# Patient Record
Sex: Female | Born: 1955 | Race: Black or African American | Hispanic: No
Health system: Southern US, Community
[De-identification: ages and names within clinical notes are randomized; demographics above are authoritative.]

## PROBLEM LIST (undated history)

## (undated) DIAGNOSIS — K219 Gastro-esophageal reflux disease without esophagitis: Secondary | ICD-10-CM

## (undated) DIAGNOSIS — I10 Essential (primary) hypertension: Secondary | ICD-10-CM

---

## 2004-05-24 ENCOUNTER — Emergency Department (HOSPITAL_COMMUNITY): Admission: EM | Admit: 2004-05-24 | Discharge: 2004-05-24 | Payer: Self-pay | Admitting: Emergency Medicine

## 2004-05-24 ENCOUNTER — Emergency Department (HOSPITAL_COMMUNITY): Admission: EM | Admit: 2004-05-24 | Discharge: 2004-05-24 | Payer: Self-pay | Admitting: Neurology

## 2007-05-26 ENCOUNTER — Ambulatory Visit: Payer: Self-pay | Admitting: Internal Medicine

## 2007-05-26 ENCOUNTER — Ambulatory Visit: Payer: Self-pay | Admitting: *Deleted

## 2008-05-09 ENCOUNTER — Ambulatory Visit: Payer: Self-pay | Admitting: Internal Medicine

## 2008-05-20 ENCOUNTER — Inpatient Hospital Stay (HOSPITAL_COMMUNITY): Admission: AD | Admit: 2008-05-20 | Discharge: 2008-05-26 | Payer: Self-pay | Admitting: Psychiatry

## 2008-05-20 ENCOUNTER — Ambulatory Visit: Payer: Self-pay | Admitting: Psychiatry

## 2008-06-16 ENCOUNTER — Ambulatory Visit (HOSPITAL_COMMUNITY): Admission: RE | Admit: 2008-06-16 | Discharge: 2008-06-16 | Payer: Self-pay | Admitting: Family Medicine

## 2008-08-29 ENCOUNTER — Ambulatory Visit: Payer: Self-pay | Admitting: Internal Medicine

## 2008-08-29 ENCOUNTER — Encounter: Payer: Self-pay | Admitting: Internal Medicine

## 2009-03-28 ENCOUNTER — Ambulatory Visit: Payer: Self-pay | Admitting: Internal Medicine

## 2009-12-09 ENCOUNTER — Inpatient Hospital Stay (HOSPITAL_COMMUNITY): Admission: EM | Admit: 2009-12-09 | Discharge: 2009-12-18 | Payer: Self-pay | Admitting: Emergency Medicine

## 2009-12-10 ENCOUNTER — Encounter (INDEPENDENT_AMBULATORY_CARE_PROVIDER_SITE_OTHER): Payer: Self-pay

## 2009-12-11 ENCOUNTER — Ambulatory Visit: Payer: Self-pay | Admitting: Hematology and Oncology

## 2009-12-12 ENCOUNTER — Encounter (INDEPENDENT_AMBULATORY_CARE_PROVIDER_SITE_OTHER): Payer: Self-pay | Admitting: Internal Medicine

## 2009-12-12 ENCOUNTER — Ambulatory Visit: Payer: Self-pay | Admitting: Thoracic Surgery

## 2009-12-13 ENCOUNTER — Encounter: Payer: Self-pay | Admitting: Internal Medicine

## 2009-12-26 ENCOUNTER — Ambulatory Visit: Payer: Self-pay | Admitting: Internal Medicine

## 2009-12-26 LAB — CONVERTED CEMR LAB
Basophils Absolute: 0.1 10*3/uL (ref 0.0–0.1)
Eosinophils Relative: 5 % (ref 0–5)
HCT: 29.5 % — ABNORMAL LOW (ref 36.0–46.0)
Hemoglobin: 8.2 g/dL — ABNORMAL LOW (ref 12.0–15.0)
Lymphocytes Relative: 13 % (ref 12–46)
Lymphs Abs: 1.2 10*3/uL (ref 0.7–4.0)
Monocytes Absolute: 0.7 10*3/uL (ref 0.1–1.0)
Neutro Abs: 6.6 10*3/uL (ref 1.7–7.7)
RBC: 3.85 M/uL — ABNORMAL LOW (ref 3.87–5.11)
Retic Ct Pct: 0.9 % (ref 0.4–3.1)
WBC: 9 10*3/uL (ref 4.0–10.5)

## 2010-01-01 ENCOUNTER — Ambulatory Visit: Payer: Self-pay | Admitting: Family Medicine

## 2010-01-11 ENCOUNTER — Ambulatory Visit: Payer: Self-pay | Admitting: Obstetrics & Gynecology

## 2010-01-22 ENCOUNTER — Ambulatory Visit (HOSPITAL_COMMUNITY): Admission: RE | Admit: 2010-01-22 | Discharge: 2010-01-22 | Payer: Self-pay | Admitting: Gastroenterology

## 2010-03-05 ENCOUNTER — Ambulatory Visit (HOSPITAL_COMMUNITY): Admission: RE | Admit: 2010-03-05 | Discharge: 2010-03-05 | Payer: Self-pay | Admitting: Gastroenterology

## 2010-03-15 ENCOUNTER — Ambulatory Visit (HOSPITAL_COMMUNITY): Admission: RE | Admit: 2010-03-15 | Discharge: 2010-03-15 | Payer: Self-pay | Admitting: Internal Medicine

## 2010-04-01 ENCOUNTER — Ambulatory Visit: Payer: Self-pay | Admitting: Internal Medicine

## 2010-04-01 LAB — CONVERTED CEMR LAB
Basophils Absolute: 0 10*3/uL (ref 0.0–0.1)
Eosinophils Absolute: 0.2 10*3/uL (ref 0.0–0.7)
Eosinophils Relative: 4 % (ref 0–5)
Ferritin: 111 ng/mL (ref 10–291)
HCT: 41.8 % (ref 36.0–46.0)
Iron: 39 ug/dL — ABNORMAL LOW (ref 42–145)
Lymphocytes Relative: 28 % (ref 12–46)
Lymphs Abs: 1.2 10*3/uL (ref 0.7–4.0)
MCV: 79.9 fL (ref 78.0–100.0)
Neutrophils Relative %: 61 % (ref 43–77)
Platelets: 272 10*3/uL (ref 150–400)
RDW: 20.3 % — ABNORMAL HIGH (ref 11.5–15.5)
Saturation Ratios: 13 % — ABNORMAL LOW (ref 20–55)
UIBC: 264 ug/dL
WBC: 4.3 10*3/uL (ref 4.0–10.5)

## 2010-08-21 ENCOUNTER — Ambulatory Visit: Payer: Self-pay | Admitting: Family Medicine

## 2010-08-22 ENCOUNTER — Ambulatory Visit
Admission: RE | Admit: 2010-08-22 | Discharge: 2010-08-22 | Payer: Self-pay | Source: Home / Self Care | Attending: Gynecologic Oncology | Admitting: Gynecologic Oncology

## 2010-08-26 ENCOUNTER — Ambulatory Visit: Payer: Self-pay | Admitting: Obstetrics & Gynecology

## 2010-10-03 LAB — ABO/RH: ABO/RH(D): A POS

## 2010-10-03 LAB — CBC
HCT: 39.5 % (ref 36.0–46.0)
MCH: 25.7 pg — ABNORMAL LOW (ref 26.0–34.0)
MCV: 80 fL (ref 78.0–100.0)
RBC: 4.94 MIL/uL (ref 3.87–5.11)
RDW: 15.6 % — ABNORMAL HIGH (ref 11.5–15.5)
WBC: 4.8 10*3/uL (ref 4.0–10.5)

## 2010-10-03 LAB — TYPE AND SCREEN: ABO/RH(D): A POS

## 2010-10-03 LAB — DIFFERENTIAL
Basophils Absolute: 0 10*3/uL (ref 0.0–0.1)
Basophils Relative: 1 % (ref 0–1)
Eosinophils Relative: 4 % (ref 0–5)
Lymphocytes Relative: 27 % (ref 12–46)

## 2010-10-03 LAB — COMPREHENSIVE METABOLIC PANEL
ALT: 17 U/L (ref 0–35)
AST: 20 U/L (ref 0–37)
Alkaline Phosphatase: 57 U/L (ref 39–117)
CO2: 26 mEq/L (ref 19–32)
Chloride: 106 mEq/L (ref 96–112)
Creatinine, Ser: 1.11 mg/dL (ref 0.4–1.2)
Potassium: 4.1 mEq/L (ref 3.5–5.1)
Sodium: 140 mEq/L (ref 135–145)
Total Protein: 7.8 g/dL (ref 6.0–8.3)

## 2010-10-03 LAB — SURGICAL PCR SCREEN
MRSA, PCR: NEGATIVE
Staphylococcus aureus: NEGATIVE

## 2010-10-08 ENCOUNTER — Inpatient Hospital Stay (HOSPITAL_COMMUNITY)
Admission: RE | Admit: 2010-10-08 | Discharge: 2010-10-11 | DRG: 743 | Disposition: A | Discharge status: Home Health | Payer: Medicaid Other | Attending: Obstetrics & Gynecology | Admitting: Obstetrics & Gynecology

## 2010-10-08 DIAGNOSIS — F209 Schizophrenia, unspecified: Secondary | ICD-10-CM | POA: Diagnosis present

## 2010-10-08 DIAGNOSIS — K219 Gastro-esophageal reflux disease without esophagitis: Secondary | ICD-10-CM | POA: Diagnosis present

## 2010-10-08 DIAGNOSIS — Z6832 Body mass index (BMI) 32.0-32.9, adult: Secondary | ICD-10-CM

## 2010-10-08 DIAGNOSIS — R509 Fever, unspecified: Secondary | ICD-10-CM | POA: Diagnosis present

## 2010-10-08 DIAGNOSIS — Z86718 Personal history of other venous thrombosis and embolism: Secondary | ICD-10-CM | POA: Diagnosis present

## 2010-10-08 DIAGNOSIS — D259 Leiomyoma of uterus, unspecified: Principal | ICD-10-CM | POA: Diagnosis present

## 2010-10-08 LAB — HEMOGLOBIN AND HEMATOCRIT, BLOOD: Hemoglobin: 12.5 g/dL (ref 12.0–15.0)

## 2010-10-09 LAB — CBC
HCT: 34.7 % — ABNORMAL LOW (ref 36.0–46.0)
MCH: 25.6 pg — ABNORMAL LOW (ref 26.0–34.0)
MCV: 78.5 fL (ref 78.0–100.0)
RBC: 4.42 MIL/uL (ref 3.87–5.11)
WBC: 9.1 10*3/uL (ref 4.0–10.5)

## 2010-10-09 LAB — BASIC METABOLIC PANEL
BUN: 6 mg/dL (ref 6–23)
CO2: 25 mEq/L (ref 19–32)
Chloride: 101 mEq/L (ref 96–112)
Creatinine, Ser: 1 mg/dL (ref 0.4–1.2)
Glucose, Bld: 146 mg/dL — ABNORMAL HIGH (ref 70–99)
Potassium: 4.3 mEq/L (ref 3.5–5.1)

## 2010-10-10 NOTE — Op Note (Addendum)
[name], [name]               ACCOUNT NO.:  [id number]  MEDICAL RECORD NO.:  [id number]          PATIENT TYPE:  WOC  LOCATION:  WOC                          FACILITY:  [facility]  PHYSICIAN:  De Blanch, M.D.DATE OF BIRTH:  Sep 10, 1955  DATE OF PROCEDURE: DATE OF DISCHARGE:                              OPERATIVE REPORT   PREOPERATIVE DIAGNOSIS:  Symptomatic uterine fibroids.  POSTOPERATIVE DIAGNOSIS:  Symptomatic uterine fibroids.  PROCEDURE:  Total abdominal hysterectomy, bilateral salpingo- oophorectomy.  SURGEON:  De Blanch, M.D.  ASSISTANT: 1. Antionette Char, M.D. 2. Telford Nab, R.N.  ANESTHESIA:  General orotracheal tube.  ESTIMATED BLOOD LOSS:  400 mL.  SURGICAL FINDINGS:  At the time of exploratory laparotomy, the patient had a large (12 cm) with a myoma that was pedunculated arising from the fundus of the uterus and extending in the upper abdomen.  A new parasitic blood supply had been derived from the mesentery of the jejunum.  The tubes and ovaries appeared normal.  The uterus had some additional small uterine fibroids.  There were very large veins on the ovarian pedicle as well as uterine and bladder flap.  Exploration of the upper abdomen was normal.  The appendix appeared normal.  PROCEDURE:  The patient was brought to the operating room, and after satisfactory attainment of general anesthesia, was placed in a modified lithotomy position in [place] stirrups.  The anterior abdominal wall, perineum and vagina were prepped.  Foley catheter was inserted, and the patient was draped.  The abdomen was entered through a midline incision. Pelvic washings were obtained.  The upper abdomen and pelvis were explored with the above-noted findings.  It was noted that the parasitic blood supply was being derived from the mesentery of the jejunum.  Using sharp and blunt dissection and cautery for hemostasis, the mesentery was freed from the  uterine fibroid.  The Bookwalter retractor was assembled, and small bowel was packed out of the pelvis.  The uterus was elevated, and the retroperitoneal spaces were opened.  The ovarian vessels were isolated away from the ureter, clamped, free-tied and suture-ligated. On the left, additional bleeding was noted in additional ligature, and the hemoclips were used to achieve hemostasis.  The bladder flap was advanced with sharp and blunt dissection, and hemostasis again being achieved with cautery.  Uterine vessels were skeletonized and clamped, cut, and suture-ligated.  In a stepwise fashion the paracervical and cardinal ligaments were clamped, cut and suture-ligated.  In order to gain better exposure to the cervix, the uterus was amputated about midway down the cervix and removed from the operative field.  The remainder of the cervix was then removed, the vaginal angles were cross- clamped and divided, and the vagina transected from its connection to the cervix.  The cervix was handed off in the operative field.  Vaginal angles were transfixed with 0 Vicryl and the central portion of vagina closed with interrupted figure-of-eight sutures of 0 Vicryl.  The pelvis was irrigated and found to be hemostatic.  The packs and retractors were removed.  The mesenteric attachment to the fibroid was reinspected and found to be hemostatic.  The abdomen  was closed in layers, the first being a running mass closure using #1 PDS incorporating the fascia, rectus muscle and peritoneum. Subcutaneous tissue was irrigated.  Additional hemostasis was achieved with cautery.  Skin was closed with skin staples.  A pressure dressing was applied.  The patient was awakened from anesthesia and taken to the recovery room in satisfactory condition.  Sponge, needle and instrument counts correct x2.     De Blanch, M.D.     DC/MEDQ  D:  10/08/2010  T:  10/08/2010  Job:  [id number]  cc:   Telford Nab,  R.N. [address] [place], [place] [address]  Roseanna Rainbow, M.D. Fax: [phone]  Altha Harm, MD  Shelbie Proctor Shawnie Pons, M.D.  Electronically Signed by De Blanch M.D. on 10/09/2010 07:30:46 AM

## 2010-11-18 NOTE — Discharge Summary (Signed)
  [name], [name]               ACCOUNT NO.:  [id number]  MEDICAL RECORD NO.:  [id number]           PATIENT TYPE:  I  LOCATION:  1531                         FACILITY:  [facility]  PHYSICIAN:  Roseanna Rainbow, M.D.DATE OF BIRTH:  [birth year]  DATE OF ADMISSION:  10/08/2010 DATE OF DISCHARGE:  10/11/2010                              DISCHARGE SUMMARY   CHIEF COMPLAINT:  The patient is a 55 year old with uterine fibroids, who presents for a total abdominal hysterectomy and bilateral salpingo- oophorectomy.  (Please see the dictated history and physical for further details.)  HOSPITAL COURSE:  The patient was admitted and underwent a total abdominal hysterectomy and bilateral salpingo-oophorectomy.  (Please see the dictated operative summary for findings.)  On postoperative day #1, a hemoglobin was 11.3.  On postoperative day #2, she had a low-grade temperature of 100.1.  Her diet was  advanced.  She remained afebrilefor the remainder of the hospital course.  She was discharged home on postoperative #3, tolerating a regular diet.  DISCHARGE DIAGNOSES: 1. Uterine fibroids. 2. History of lower leg deep venous thrombosis that was treated with a     caval filter.  PROCEDURE:  Total abdominal hysterectomy and bilateral salpingo- oophorectomy.  CONDITION ON DISCHARGE:  Good.  DIET:  Regular.  ACTIVITY:  Follow a progressive activity.  MEDICATIONS:  Calcium, iron, Glucophage, fish oil, aspirin, olanzapine, omeprazole, Percocet 5/325 1 tablet q.5 h as needed.  DISPOSITION:  The patient is to follow up with her gynecologist's office.     Roseanna Rainbow, M.D.     [name]  D:  11/12/2010  T:  11/12/2010  Job:  [id number]  cc:   Valentino Nose, MD  Electronically Signed by Antionette Char M.D. on 11/18/2010 09:27:02 PM

## 2010-11-21 ENCOUNTER — Ambulatory Visit: Payer: Medicaid Other | Attending: Gynecologic Oncology | Admitting: Gynecologic Oncology

## 2010-11-21 DIAGNOSIS — D259 Leiomyoma of uterus, unspecified: Secondary | ICD-10-CM | POA: Insufficient documentation

## 2010-11-21 DIAGNOSIS — N840 Polyp of corpus uteri: Secondary | ICD-10-CM | POA: Insufficient documentation

## 2010-11-21 DIAGNOSIS — N859 Noninflammatory disorder of uterus, unspecified: Secondary | ICD-10-CM | POA: Insufficient documentation

## 2010-11-21 DIAGNOSIS — N895 Stricture and atresia of vagina: Secondary | ICD-10-CM | POA: Insufficient documentation

## 2010-11-21 DIAGNOSIS — Z09 Encounter for follow-up examination after completed treatment for conditions other than malignant neoplasm: Secondary | ICD-10-CM | POA: Insufficient documentation

## 2010-11-27 LAB — DIFFERENTIAL
Band Neutrophils: 0 % (ref 0–10)
Band Neutrophils: 0 % (ref 0–10)
Basophils Absolute: 0 10*3/uL (ref 0.0–0.1)
Basophils Absolute: 0 10*3/uL (ref 0.0–0.1)
Basophils Absolute: 0 10*3/uL (ref 0.0–0.1)
Basophils Absolute: 0 10*3/uL (ref 0.0–0.1)
Basophils Relative: 0 % (ref 0–1)
Basophils Relative: 0 % (ref 0–1)
Basophils Relative: 0 % (ref 0–1)
Blasts: 0 %
Blasts: 0 %
Blasts: 0 %
Blasts: 0 %
Eosinophils Absolute: 0 10*3/uL (ref 0.0–0.7)
Eosinophils Absolute: 0 10*3/uL (ref 0.0–0.7)
Eosinophils Absolute: 0.1 10*3/uL (ref 0.0–0.7)
Eosinophils Absolute: 0.4 10*3/uL (ref 0.0–0.7)
Eosinophils Absolute: 0.4 10*3/uL (ref 0.0–0.7)
Eosinophils Absolute: 0.5 10*3/uL (ref 0.0–0.7)
Eosinophils Relative: 1 % (ref 0–5)
Eosinophils Relative: 3 % (ref 0–5)
Eosinophils Relative: 4 % (ref 0–5)
Eosinophils Relative: 4 % (ref 0–5)
Lymphocytes Relative: 10 % — ABNORMAL LOW (ref 12–46)
Lymphocytes Relative: 11 % — ABNORMAL LOW (ref 12–46)
Lymphocytes Relative: 15 % (ref 12–46)
Lymphs Abs: 1.1 10*3/uL (ref 0.7–4.0)
Lymphs Abs: 1.2 10*3/uL (ref 0.7–4.0)
Lymphs Abs: 1.4 10*3/uL (ref 0.7–4.0)
Metamyelocytes Relative: 0 %
Metamyelocytes Relative: 0 %
Metamyelocytes Relative: 0 %
Metamyelocytes Relative: 0 %
Monocytes Absolute: 0.4 10*3/uL (ref 0.1–1.0)
Monocytes Absolute: 0.6 10*3/uL (ref 0.1–1.0)
Monocytes Absolute: 0.6 10*3/uL (ref 0.1–1.0)
Monocytes Absolute: 0.9 10*3/uL (ref 0.1–1.0)
Monocytes Absolute: 1.2 10*3/uL — ABNORMAL HIGH (ref 0.1–1.0)
Monocytes Relative: 3 % (ref 3–12)
Monocytes Relative: 4 % (ref 3–12)
Monocytes Relative: 5 % (ref 3–12)
Monocytes Relative: 5 % (ref 3–12)
Monocytes Relative: 7 % (ref 3–12)
Monocytes Relative: 9 % (ref 3–12)
Myelocytes: 0 %
Myelocytes: 0 %
Myelocytes: 0 %
Myelocytes: 0 %
Myelocytes: 0 %
Myelocytes: 1 %
Neutro Abs: 10 10*3/uL — ABNORMAL HIGH (ref 1.7–7.7)
Neutro Abs: 11 10*3/uL — ABNORMAL HIGH (ref 1.7–7.7)
Neutro Abs: 9 10*3/uL — ABNORMAL HIGH (ref 1.7–7.7)
Neutrophils Relative %: 78 % — ABNORMAL HIGH (ref 43–77)
Neutrophils Relative %: 80 % — ABNORMAL HIGH (ref 43–77)
Neutrophils Relative %: 81 % — ABNORMAL HIGH (ref 43–77)
Neutrophils Relative %: 82 % — ABNORMAL HIGH (ref 43–77)
Promyelocytes Absolute: 0 %
Promyelocytes Absolute: 0 %
nRBC: 0 /100 WBC
nRBC: 0 /100 WBC
nRBC: 0 /100 WBC
nRBC: 0 /100 WBC
nRBC: 0 /100 WBC

## 2010-11-27 LAB — RETICULOCYTES
RBC.: 3.76 MIL/uL — ABNORMAL LOW (ref 3.87–5.11)
Retic Ct Pct: 1.4 % (ref 0.4–3.1)

## 2010-11-27 LAB — CBC
HCT: 22.4 % — ABNORMAL LOW (ref 36.0–46.0)
HCT: 25 % — ABNORMAL LOW (ref 36.0–46.0)
HCT: 26.7 % — ABNORMAL LOW (ref 36.0–46.0)
HCT: 27.9 % — ABNORMAL LOW (ref 36.0–46.0)
Hemoglobin: 7.7 g/dL — ABNORMAL LOW (ref 12.0–15.0)
Hemoglobin: 7.8 g/dL — ABNORMAL LOW (ref 12.0–15.0)
Hemoglobin: 8.2 g/dL — ABNORMAL LOW (ref 12.0–15.0)
Hemoglobin: 8.6 g/dL — ABNORMAL LOW (ref 12.0–15.0)
MCHC: 29.5 g/dL — ABNORMAL LOW (ref 30.0–36.0)
MCHC: 31 g/dL (ref 30.0–36.0)
MCHC: 31.3 g/dL (ref 30.0–36.0)
MCHC: 31.3 g/dL (ref 30.0–36.0)
MCHC: 31.4 g/dL (ref 30.0–36.0)
MCHC: 31.5 g/dL (ref 30.0–36.0)
MCHC: 31.7 g/dL (ref 30.0–36.0)
MCV: 63.4 fL — ABNORMAL LOW (ref 78.0–100.0)
MCV: 70.3 fL — ABNORMAL LOW (ref 78.0–100.0)
MCV: 71.2 fL — ABNORMAL LOW (ref 78.0–100.0)
MCV: 71.4 fL — ABNORMAL LOW (ref 78.0–100.0)
MCV: 71.4 fL — ABNORMAL LOW (ref 78.0–100.0)
MCV: 73.7 fL — ABNORMAL LOW (ref 78.0–100.0)
MCV: 74.5 fL — ABNORMAL LOW (ref 78.0–100.0)
Platelets: 244 10*3/uL (ref 150–400)
Platelets: 262 10*3/uL (ref 150–400)
Platelets: 276 10*3/uL (ref 150–400)
Platelets: 283 10*3/uL (ref 150–400)
Platelets: 318 10*3/uL (ref 150–400)
Platelets: 345 10*3/uL (ref 150–400)
Platelets: 354 10*3/uL (ref 150–400)
RBC: 3.34 MIL/uL — ABNORMAL LOW (ref 3.87–5.11)
RBC: 3.35 MIL/uL — ABNORMAL LOW (ref 3.87–5.11)
RBC: 3.42 MIL/uL — ABNORMAL LOW (ref 3.87–5.11)
RBC: 3.53 MIL/uL — ABNORMAL LOW (ref 3.87–5.11)
RBC: 3.65 MIL/uL — ABNORMAL LOW (ref 3.87–5.11)
RDW: 29 % — ABNORMAL HIGH (ref 11.5–15.5)
RDW: 29.1 % — ABNORMAL HIGH (ref 11.5–15.5)
RDW: 29.6 % — ABNORMAL HIGH (ref 11.5–15.5)
RDW: 30.1 % — ABNORMAL HIGH (ref 11.5–15.5)
RDW: 33.8 % — ABNORMAL HIGH (ref 11.5–15.5)
WBC: 11.7 10*3/uL — ABNORMAL HIGH (ref 4.0–10.5)
WBC: 12.2 10*3/uL — ABNORMAL HIGH (ref 4.0–10.5)
WBC: 12.5 10*3/uL — ABNORMAL HIGH (ref 4.0–10.5)
WBC: 12.6 10*3/uL — ABNORMAL HIGH (ref 4.0–10.5)
WBC: 14.1 10*3/uL — ABNORMAL HIGH (ref 4.0–10.5)

## 2010-11-27 LAB — CARDIAC PANEL(CRET KIN+CKTOT+MB+TROPI)
CK, MB: 0.4 ng/mL (ref 0.3–4.0)
Relative Index: INVALID (ref 0.0–2.5)
Total CK: 55 U/L (ref 7–177)
Troponin I: 0.01 ng/mL (ref 0.00–0.06)

## 2010-11-27 LAB — CROSSMATCH
ABO/RH(D): A POS
Antibody Screen: NEGATIVE

## 2010-11-27 LAB — CULTURE, BLOOD (ROUTINE X 2)
Culture: NO GROWTH
Culture: NO GROWTH

## 2010-11-27 LAB — URINALYSIS, MICROSCOPIC ONLY
Glucose, UA: NEGATIVE mg/dL
Hgb urine dipstick: NEGATIVE
Leukocytes, UA: NEGATIVE
Protein, ur: NEGATIVE mg/dL
Specific Gravity, Urine: 1.014 (ref 1.005–1.030)
Specific Gravity, Urine: 1.024 (ref 1.005–1.030)
Urobilinogen, UA: 0.2 mg/dL (ref 0.0–1.0)
Urobilinogen, UA: 1 mg/dL (ref 0.0–1.0)

## 2010-11-27 LAB — POCT I-STAT, CHEM 8
BUN: 11 mg/dL (ref 6–23)
Calcium, Ion: 1.17 mmol/L (ref 1.12–1.32)
Creatinine, Ser: 1.1 mg/dL (ref 0.4–1.2)
Glucose, Bld: 112 mg/dL — ABNORMAL HIGH (ref 70–99)
Hemoglobin: 8.8 g/dL — ABNORMAL LOW (ref 12.0–15.0)
Sodium: 139 mEq/L (ref 135–145)
TCO2: 21 mmol/L (ref 0–100)

## 2010-11-27 LAB — COMPREHENSIVE METABOLIC PANEL
ALT: <8 U/L (ref 0–35)
AST: 14 U/L (ref 0–37)
CO2: 24 mEq/L (ref 19–32)
Calcium: 8.6 mg/dL (ref 8.4–10.5)
Chloride: 108 mEq/L (ref 96–112)
GFR calc Af Amer: >60 mL/min (ref >60)
GFR calc non Af Amer: 54 mL/min — ABNORMAL LOW (ref >60)
Sodium: 137 mEq/L (ref 135–145)

## 2010-11-27 LAB — HEMOGLOBIN AND HEMATOCRIT, BLOOD
HCT: 26 % — ABNORMAL LOW (ref 36.0–46.0)
HCT: 27.5 % — ABNORMAL LOW (ref 36.0–46.0)
Hemoglobin: 8.5 g/dL — ABNORMAL LOW (ref 12.0–15.0)

## 2010-11-27 LAB — BASIC METABOLIC PANEL
BUN: 5 mg/dL — ABNORMAL LOW (ref 6–23)
BUN: 6 mg/dL (ref 6–23)
BUN: 8 mg/dL (ref 6–23)
CO2: 21 mEq/L (ref 19–32)
CO2: 23 mEq/L (ref 19–32)
CO2: 23 mEq/L (ref 19–32)
Calcium: 8.9 mg/dL (ref 8.4–10.5)
Chloride: 103 mEq/L (ref 96–112)
Chloride: 107 mEq/L (ref 96–112)
Chloride: 108 mEq/L (ref 96–112)
Creatinine, Ser: 0.91 mg/dL (ref 0.4–1.2)
Creatinine, Ser: 1.05 mg/dL (ref 0.4–1.2)
Creatinine, Ser: 1.08 mg/dL (ref 0.4–1.2)
GFR calc Af Amer: >60 mL/min (ref >60)
GFR calc non Af Amer: 53 mL/min — ABNORMAL LOW (ref >60)
GFR calc non Af Amer: >60 mL/min (ref >60)
Glucose, Bld: 110 mg/dL — ABNORMAL HIGH (ref 70–99)
Glucose, Bld: 95 mg/dL (ref 70–99)
Potassium: 3.6 mEq/L (ref 3.5–5.1)
Sodium: 131 mEq/L — ABNORMAL LOW (ref 135–145)
Sodium: 136 mEq/L (ref 135–145)

## 2010-11-27 LAB — URINE CULTURE

## 2010-11-27 LAB — FERRITIN: Ferritin: 3 ng/mL — ABNORMAL LOW (ref 10–291)

## 2010-11-27 LAB — GLUCOSE, CAPILLARY: Glucose-Capillary: 102 mg/dL — ABNORMAL HIGH (ref 70–99)

## 2010-11-27 LAB — HEPARIN LEVEL (UNFRACTIONATED): Heparin Unfractionated: 0.47 IU/mL (ref 0.30–0.70)

## 2010-11-27 LAB — IRON AND TIBC: UIBC: 408 ug/dL

## 2010-11-27 LAB — PROTIME-INR: Prothrombin Time: 15.1 seconds (ref 11.6–15.2)

## 2010-12-06 NOTE — Consult Note (Signed)
  NAMETAREN, DYMEK               ACCOUNT NO.:  [id number]  MEDICAL RECORD NO.:  [id number]           PATIENT TYPE:  LOCATION:                                 FACILITY:  PHYSICIAN:  Laurette Schimke, MD     DATE OF BIRTH:  1955-12-13  DATE OF CONSULTATION:  11/21/2010 DATE OF DISCHARGE:                                CONSULTATION   REASON FOR VISIT:  Postoperative check.  HISTORY OF PRESENT ILLNESS:  This is a 55 year old who presented to the emergency room and was noted to have an ulcerated esophageal lesion. Significant anemia was appreciated and she was noted to have a deep venous thrombus.  Because of the upper GI bleed, an IVC filter was obtained and an MRI of the pelvis was done.  This demonstrated a uterus diffusely enlarged and noted to have multiple leiomyomas.  Subsequent PET evaluation for evaluation of erosive gastritis did not show any evidence of a local esophageal cancer or metastatic disease.  However, moderate FDG activity was noted within the uterine mass.  On October 08, 2010, she underwent a total abdominal hysterectomy, bilateral salpingo- oophorectomy.  Final pathology was notable for uterine leiomyomata, atrophic and benign endometrial polyps without any evidence of hyperplasia or malignancy, and bilateral benign serous cysts. Postoperatively, Ms. Lippert did well and is without complaints.  She denies nausea, vomiting.  No fever, chills, abdominal pain.  She reports normal bowel movements.  She denies any vaginal bleeding.  PHYSICAL EXAMINATION:  VITAL SIGNS:  Weight 179 pounds, blood pressure 118/70, pulse of 78. ABDOMEN:  Soft, nontender.  Midline incision is appreciated with no evidence of separation, cellulitis, or erythema or hernia.  There is no rebound or guarding. PELVIC EXAMINATION:  Speculum examination was attempted; however, significant vaginal stenosis was appreciated and the patient was very uncomfortable.  Digital examination was unremarkable  for the presence of any cul-de-sac masses or tenderness and there was no evidence of discharge or blood on examination.  IMPRESSION:  Status post total abdominal hysterectomy, bilateral salpingo-oophorectomy all with benign findings. Ms. Summons has been advised to follow up with Dr. Shawnie Pons in 6 months.  She has been advised to follow up for any abdomen or pelvic discomfort or vaginal bleeding.     Laurette Schimke, MD     WB/MEDQ  D:  11/26/2010  T:  11/27/2010  Job:  [id number]  cc:   Telford Nab, R.N. [address] [place], [place] [address]  Altha Harm, MD  Shelbie Proctor. Shawnie Pons, M.D.  Electronically Signed by Laurette Schimke MD on 12/06/2010 10:03:19 AM

## 2010-12-30 ENCOUNTER — Ambulatory Visit: Payer: Medicaid Other | Admitting: Family Medicine

## 2011-01-20 ENCOUNTER — Ambulatory Visit: Payer: Medicaid Other | Admitting: Obstetrics & Gynecology

## 2011-01-20 DIAGNOSIS — Z09 Encounter for follow-up examination after completed treatment for conditions other than malignant neoplasm: Secondary | ICD-10-CM

## 2011-01-21 NOTE — Group Therapy Note (Signed)
NAMEINITA, URAM               ACCOUNT NO.:  [id number]  MEDICAL RECORD NO.:  [id number]           PATIENT TYPE:  A  LOCATION:  [facility]                   FACILITY:  [facility]  PHYSICIAN:  Allie Bossier, MD        DATE OF BIRTH:  Nov 25, 1955  DATE OF SERVICE:  01/20/2011                                 CLINIC NOTE  Ms. Shellenbarger is a 55 year old para 1 who had TAH, exploratory laparotomy, and BSO by Dr. Tamela Oddi and Dr. De Blanch because of a very large uterine fibroid that was worrisome for malignancy, turns out that it is not a malignancy; however, it did have a parasitic blood supply from the jejunal mesentery, which required separation of that. Her surgery was apparently uncomplicated.  She reports no problems to me.  She has not been sexually active for "years."  She denies any postop problems.  On exam, her vertical incision is well healed.  She would not tolerate a speculum, but I did manage to convinced her that let me do a bimanual exam with one finger.  I felt no masses and the cuff seems well healed on exam.  ASSESSMENT AND PLAN:  Postop, doing well.  We will have her back in a year for an annual exam.  She understands that she does not need Pap smears any more, but that we would recommend vulvar and vaginal inspection on a yearly basis.     Allie Bossier, MD    MCD/MEDQ  D:  01/20/2011  T:  01/21/2011  Job:  [id number]

## 2011-01-21 NOTE — H&P (Signed)
[name], [name]               ACCOUNT NO.:  [id number]   MEDICAL RECORD NO.:  [id number]          PATIENT TYPE:  IPS   LOCATION:  0406                          [facility]ACILITY[facility]  [facility]   PHYSICIAN:  Ashley Jungling, MD  DATE OF BIRTH:  [birth year]   DATE OF ADMISSION:  05/20/2008  DATE OF DISCHARGE:                       PSYCHIATRIC ADMISSION ASSESSMENT   IDENTIFYING INFORMATION:  This is an involuntary admission of this 55-  year-old divorced African American female.  The [employer]  brought her to the [facility]  yesterday.  They had found her wandering the streets.  She was noted to  have significant thought blocking, perseveration and illogical thought  trends.  She was guarded.  She stated she left her house because she did  not feel safe.  She was looking for some place to go where she could  feel safe for a few days.  She is known to [facility].  She had a similar episode back in 2005.  She was given a  diagnosis of psychotic disorder, not otherwise specified.  She was  maintained on an atypical antipsychotic for awhile, cleared and returned  to work.  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  She is quite anxious to return to work.  Yesterday upon  admission, it was difficult to direct her.  She was quite paranoid.  She  was given Haldol 5 mg and Ativan 2 mg IM on admission.  That was  followed up with Haldol on deck 50 mg.  She has now started to take the  Haldol p.o.   PAST PSYCHIATRIC HISTORY:  Today she can tell me that a number of years  ago she went to [facility] but I got cleared.  She  could also report that she used to see [name], one of the nurses  down there.  She did not remember exactly what medication she was  taking.   SOCIAL HISTORY:  She reports having been to a multitude of colleges,  [employer], [employer] and [employer].  She states she has been married and  divorced  once.  She has a step-son whom she is close to.  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  She apparently has an  apartment and she has paper work from a variety of Teacher, music, Catering manager.  out in the community.   FAMILY HISTORY:  She denies.   ALCOHOL & DRUG HISTORY:  She denies.   PRIMARY CARE PHYSICIAN:  [name], M.D.  She has not had any  psychiatric care in several years now, probably since 2006.   PAST MEDICAL HISTORY:  She reports sinus issues from allergies.   MEDICATIONS:  She was not prescribed any.   ALLERGIES:  No known drug allergies.   PHYSICAL EXAMINATION:  GENERAL:  She is a well-developed and well-  nourished female, who appears her stated age of 16.  She had no  remarkable findings.   REVIEW OF SYSTEMS:  No positive findings.  MENTAL STATUS EXAM:  Today she is alert and at least superficially  oriented.  She has cleaned up.  She appears to be appropriately groomed,  dressed and nourished today.  Her speech is more normal.  She can follow  a conversation.  She can answer appropriately.  Her mood is calm.  She  is not paranoid.  Thought processes are clear.  She wants to go back to  work.  Judgment and insight are fair.  Concentration and memory are  fair.  Intelligence is at least average.  She denies suicidal or  homicidal ideations.  She is not  as paranoid today as she was  yesterday.   DIAGNOSES:  AXIS I.  Psychotic disorder, not otherwise specified.  AXIS II.  No diagnosis.  AXIS III.  None known.  AXIS IV.  Severe, noncompliant.  AXIS V.  30.   PLAN:  To admit for safety and stabilization.  We started Haldol p.o.  and Decanoate  We will refer to her back to [facility].   The estimated length of stay is five days.      [name], P.A.-C.      Ashley Jungling, MD  Electronically Signed    MD/MEDQ  D:  05/21/2008  T:  05/21/2008  Job:  [id number]

## 2011-01-21 NOTE — Discharge Summary (Signed)
[name], [name]               ACCOUNT NO.:  [id number]   MEDICAL RECORD NO.:  [id number]          PATIENT TYPE:  IPS   LOCATION:  0406                          [facility]ILITY[facility]   PHYSICIAN:  Anselm Jungling, MD  DATE OF BIRTH:  1955-09-24   DATE OF ADMISSION:  05/20/2008  DATE OF DISCHARGE:  05/26/2008                               DISCHARGE SUMMARY   IDENTIFYING DATA/REASON FOR ADMISSION:  The patient is a 55 year old  single African American female who came to [place] with a history of  psychotic disorder.  She was brought in by the police due to  exacerbation of her psychotic disorder, which involved disorganized  thinking and behavior.  Please refer to the admission note for further  details pertaining to the symptoms, circumstances and history that led  to her hospitalization.  She was given initial Axis I diagnosis of  psychosis NOS.   MEDICAL AND LABORATORY:  The patient was medically and physically  assessed by the psychiatric nurse practitioner.  She was in good health  without any active or chronic medical problems.  There were no  significant medical issues.   HOSPITAL COURSE:  The patient was admitted to the adult inpatient  psychiatric service.  She presented as a well-nourished, normally-  developed adult female who was unable to give any clear or coherent  history for the first 3-4 days of her hospital stay.  However, we were  able to learn more about her, and eventually we were able to make  contact with individuals from a church, that she has been very involved  in, both as an attendee, and as someone who volunteers a great deal with  various programs that they have.  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   She presented with disorganized thinking, disorientation, and appeared  to be responding to internal stimuli.  She was generally pleasant and  cooperative.   She was started on a trial of Haldol.   This was well tolerated, and over  the course of 5-6 days, she cleared nicely.  Her friend Noreene Larsson from her  church was able to come and meet with the patient, the case manager and  the undersigned, and we learned that her church is enormously supportive  to her and will continue to be so.   By the seventh hospital day the patient appeared appropriate for  discharge.  She appeared to be back to her baseline level of functioning  and was tolerating medication.  She agreed to the following aftercare  plan.   AFTERCARE:  The patient was to follow up with the [facility] with  an appointment with her psychiatrist on June 01, 2008.   DISCHARGE MEDICATIONS:  Haldol 10 mg p.o. nightly.   DISCHARGE DIAGNOSES:  Axis I:  Psychosis, NOS.  Axis II:  Deferred.  Axis III:  No acute or chronic illnesses.  Axis IV:  Stressors severe.  Axis V:  GAF on discharge 65.      Anselm Jungling, MD  Electronically Signed  SPB/MEDQ  D:  06/01/2008  T:  06/01/2008  Job:  [id number]

## 2011-04-16 ENCOUNTER — Other Ambulatory Visit: Payer: Self-pay | Admitting: Internal Medicine

## 2011-04-16 DIAGNOSIS — Z1231 Encounter for screening mammogram for malignant neoplasm of breast: Secondary | ICD-10-CM

## 2011-04-21 ENCOUNTER — Ambulatory Visit (HOSPITAL_COMMUNITY)
Admission: RE | Admit: 2011-04-21 | Discharge: 2011-04-21 | Disposition: A | Discharge status: Home / Self Care | Payer: Medicaid Other | Source: Ambulatory Visit | Attending: Internal Medicine | Admitting: Internal Medicine

## 2011-04-21 DIAGNOSIS — Z1231 Encounter for screening mammogram for malignant neoplasm of breast: Secondary | ICD-10-CM | POA: Insufficient documentation

## 2011-04-22 ENCOUNTER — Ambulatory Visit (HOSPITAL_COMMUNITY): Payer: Medicaid Other

## 2011-04-23 ENCOUNTER — Other Ambulatory Visit: Payer: Self-pay | Admitting: Internal Medicine

## 2011-04-23 DIAGNOSIS — R928 Other abnormal and inconclusive findings on diagnostic imaging of breast: Secondary | ICD-10-CM

## 2011-04-30 ENCOUNTER — Ambulatory Visit
Admission: RE | Admit: 2011-04-30 | Discharge: 2011-04-30 | Disposition: A | Discharge status: Home / Self Care | Payer: Medicaid Other | Source: Ambulatory Visit | Attending: Internal Medicine | Admitting: Internal Medicine

## 2011-04-30 DIAGNOSIS — R928 Other abnormal and inconclusive findings on diagnostic imaging of breast: Secondary | ICD-10-CM

## 2011-06-11 LAB — COMPREHENSIVE METABOLIC PANEL
ALT: 19
AST: 30
Albumin: 3.4 — ABNORMAL LOW
Alkaline Phosphatase: 39
BUN: 18
CO2: 26
Calcium: 9.1
Chloride: 110
Creatinine, Ser: 1.28 — ABNORMAL HIGH
GFR calc Af Amer: 53 — ABNORMAL LOW
GFR calc non Af Amer: 43 — ABNORMAL LOW
Glucose, Bld: 92
Potassium: 3.7
Sodium: 142
Total Bilirubin: 0.9
Total Protein: 6.8

## 2011-06-11 LAB — DRUGS OF ABUSE SCREEN W/O ALC, ROUTINE URINE
Cocaine Metabolites: NEGATIVE
Creatinine,U: 339.5
Marijuana Metabolite: NEGATIVE
Methadone: NEGATIVE
Opiate Screen, Urine: NEGATIVE
Propoxyphene: NEGATIVE

## 2011-06-11 LAB — URINE MICROSCOPIC-ADD ON

## 2011-06-11 LAB — URINALYSIS, ROUTINE W REFLEX MICROSCOPIC
Nitrite: NEGATIVE
Protein, ur: 30 — AB
Specific Gravity, Urine: 1.033 — ABNORMAL HIGH
Urobilinogen, UA: 0.2

## 2011-06-11 LAB — CBC
MCV: 85.7
Platelets: 230
RBC: 4.56
WBC: 5.3

## 2011-06-11 LAB — TSH: TSH: 0.903

## 2012-03-31 ENCOUNTER — Other Ambulatory Visit (HOSPITAL_COMMUNITY): Payer: Self-pay | Admitting: Family Medicine

## 2012-03-31 DIAGNOSIS — Z1231 Encounter for screening mammogram for malignant neoplasm of breast: Secondary | ICD-10-CM

## 2012-04-02 IMAGING — CR DG FEMUR 2V*L*
3 series · 3 of 3 positions shown · non-contrast
Comparison: None.

CLINICAL DATA: Recent fall

LEFT FEMUR - 2 VIEW

[t femur with hip  ap left]
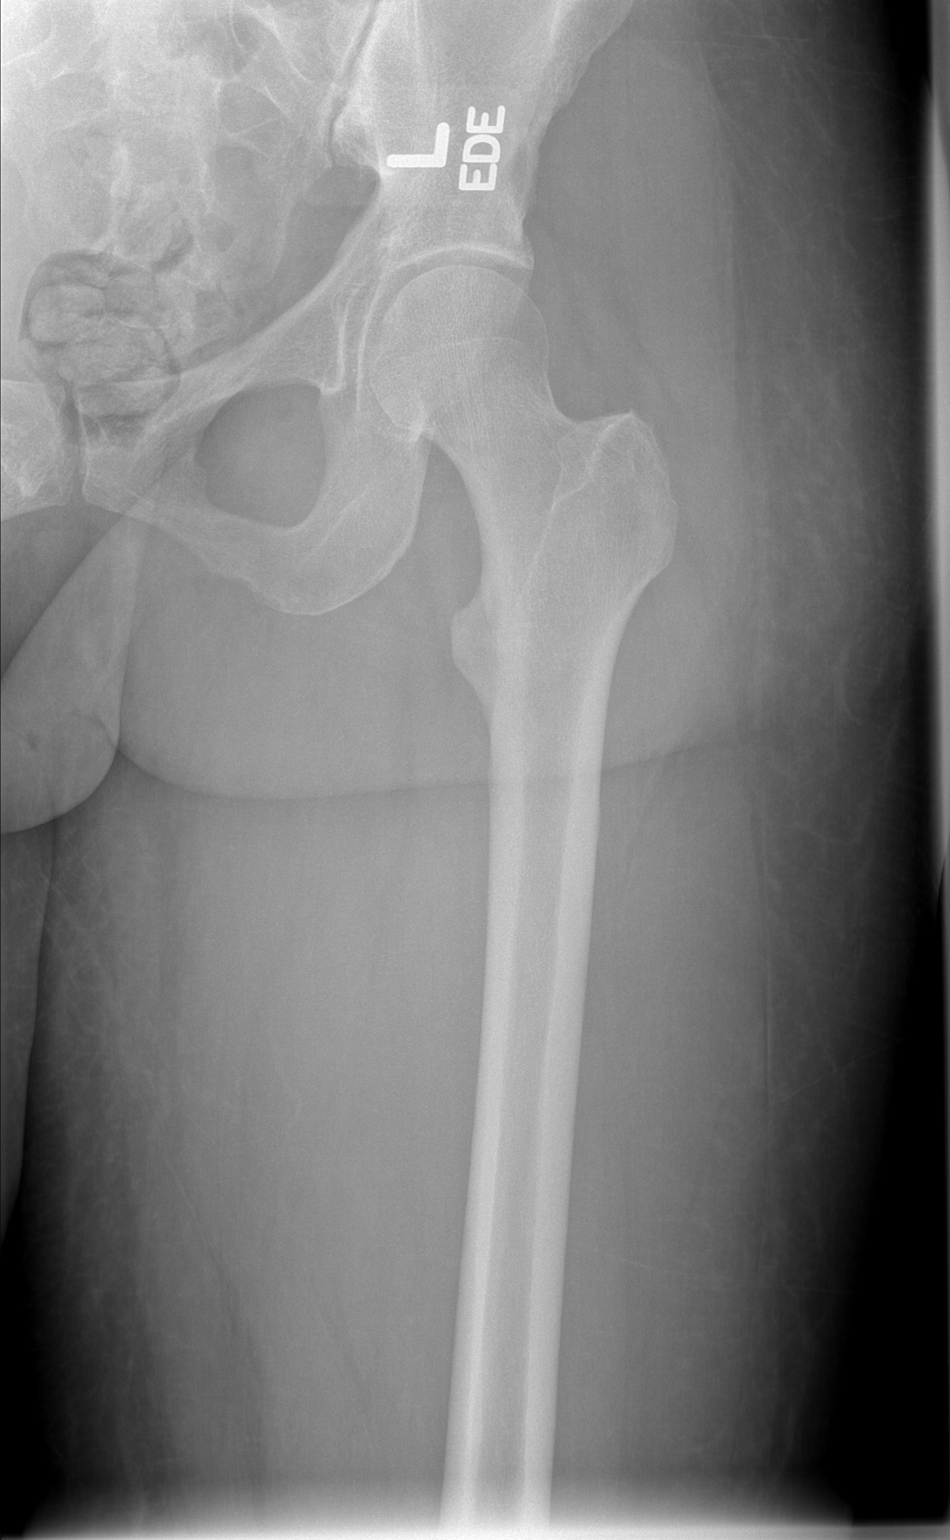

[t femur with knee ap left]
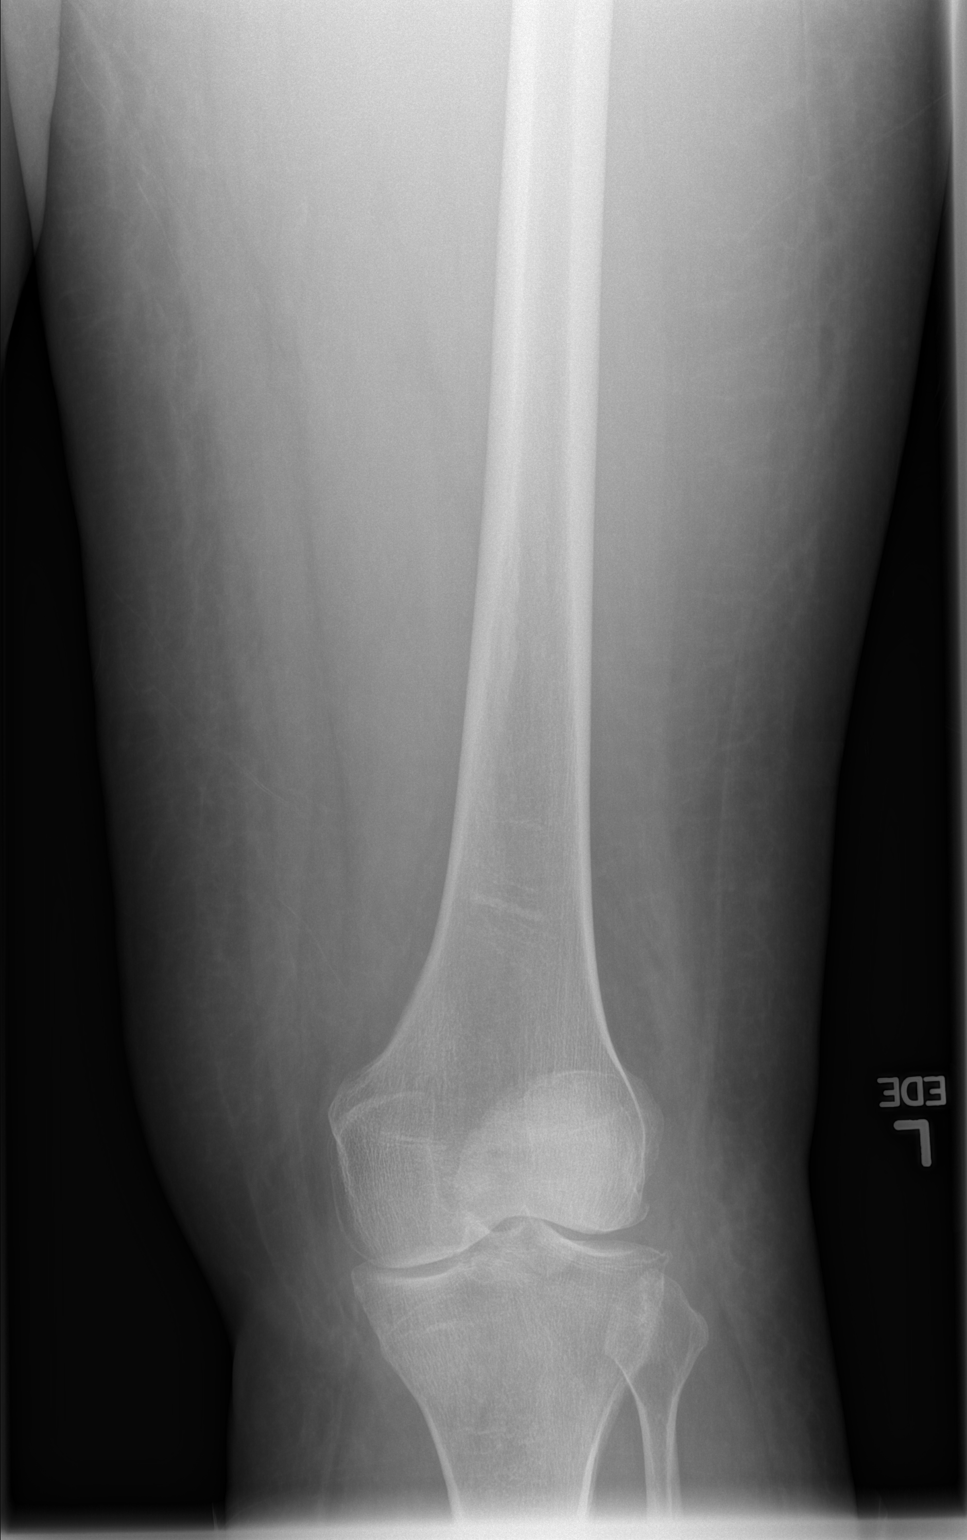

[t femur with knee lat left]
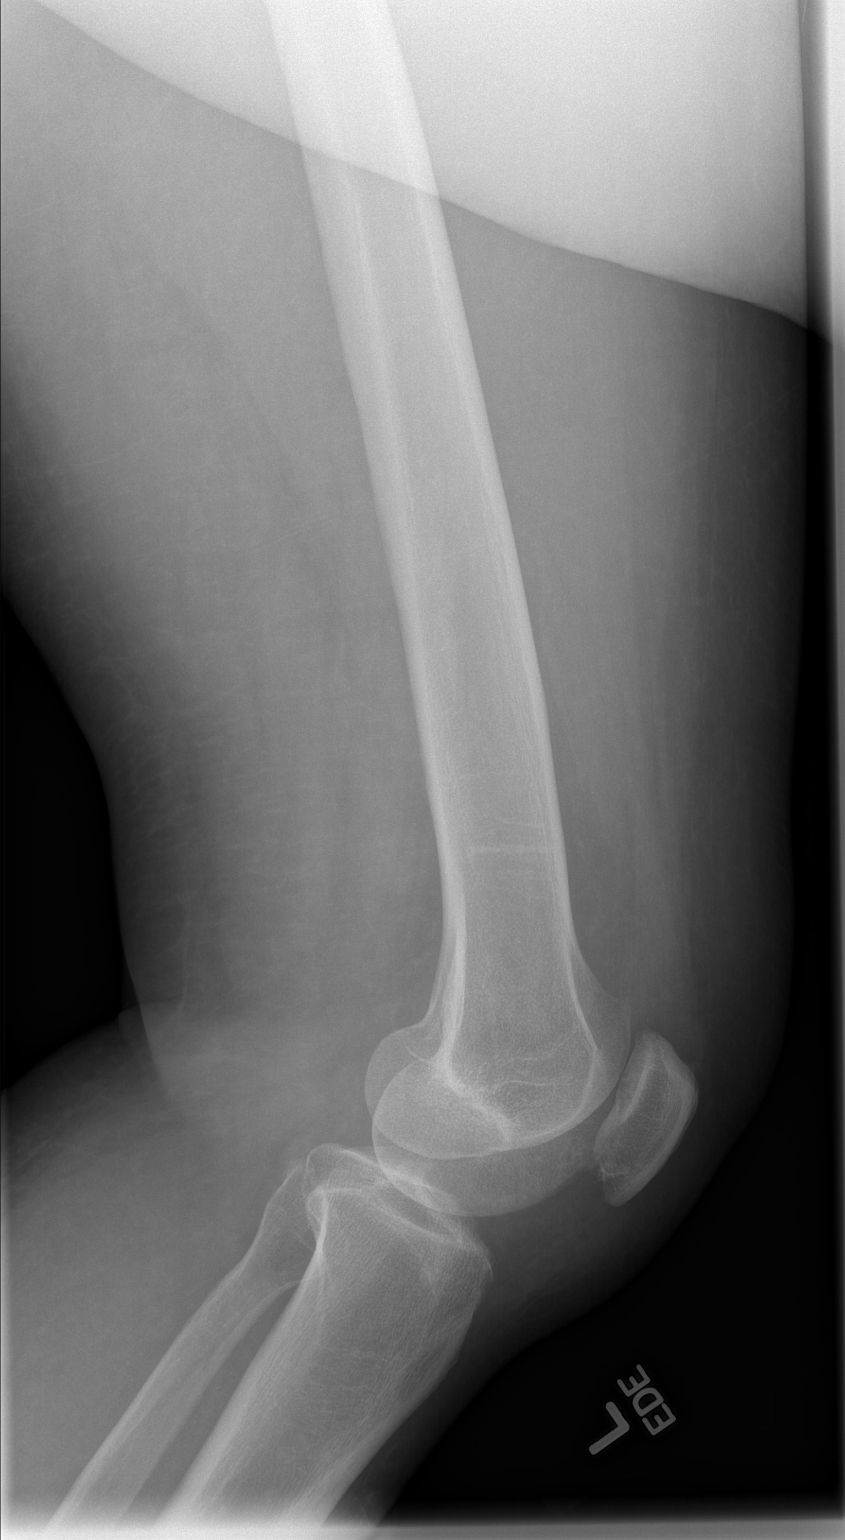

[3 of 3 positions shown; findings below may reference images not displayed]

FINDINGS: No acute bony abnormality.  No fracture or bony
displacement.
IMPRESSION: Negative left femur.

## 2012-04-22 ENCOUNTER — Ambulatory Visit (HOSPITAL_COMMUNITY): Payer: Medicaid Other

## 2012-04-30 ENCOUNTER — Ambulatory Visit (HOSPITAL_COMMUNITY)
Admission: RE | Admit: 2012-04-30 | Discharge: 2012-04-30 | Disposition: A | Discharge status: Home / Self Care | Payer: Medicare Other | Source: Ambulatory Visit | Attending: Family Medicine | Admitting: Family Medicine

## 2012-04-30 DIAGNOSIS — Z1231 Encounter for screening mammogram for malignant neoplasm of breast: Secondary | ICD-10-CM | POA: Insufficient documentation

## 2013-05-25 ENCOUNTER — Other Ambulatory Visit (HOSPITAL_COMMUNITY): Payer: Self-pay | Admitting: Family Medicine

## 2013-05-25 ENCOUNTER — Other Ambulatory Visit: Payer: Self-pay | Admitting: Rehabilitation

## 2013-05-25 ENCOUNTER — Other Ambulatory Visit: Payer: Self-pay | Admitting: Emergency Medicine

## 2013-05-25 DIAGNOSIS — Z1231 Encounter for screening mammogram for malignant neoplasm of breast: Secondary | ICD-10-CM

## 2013-05-31 ENCOUNTER — Ambulatory Visit (HOSPITAL_COMMUNITY)
Admission: RE | Admit: 2013-05-31 | Discharge: 2013-05-31 | Disposition: A | Discharge status: Home / Self Care | Payer: Medicare Other | Source: Ambulatory Visit | Attending: Emergency Medicine | Admitting: Emergency Medicine

## 2013-05-31 DIAGNOSIS — Z1231 Encounter for screening mammogram for malignant neoplasm of breast: Secondary | ICD-10-CM | POA: Insufficient documentation

## 2013-08-19 IMAGING — MG MM DIGITAL DIAG LTD R {BCG}
3 series · 3 of 3 positions shown · non-contrast
Comparison: Prior studies

CLINICAL DATA: Recall from screening mammography.

DIGITAL DIAGNOSTIC RIGHT BREAST MAMMOGRAM  AND RIGHT BREAST
ULTRASOUND:

[R CC]
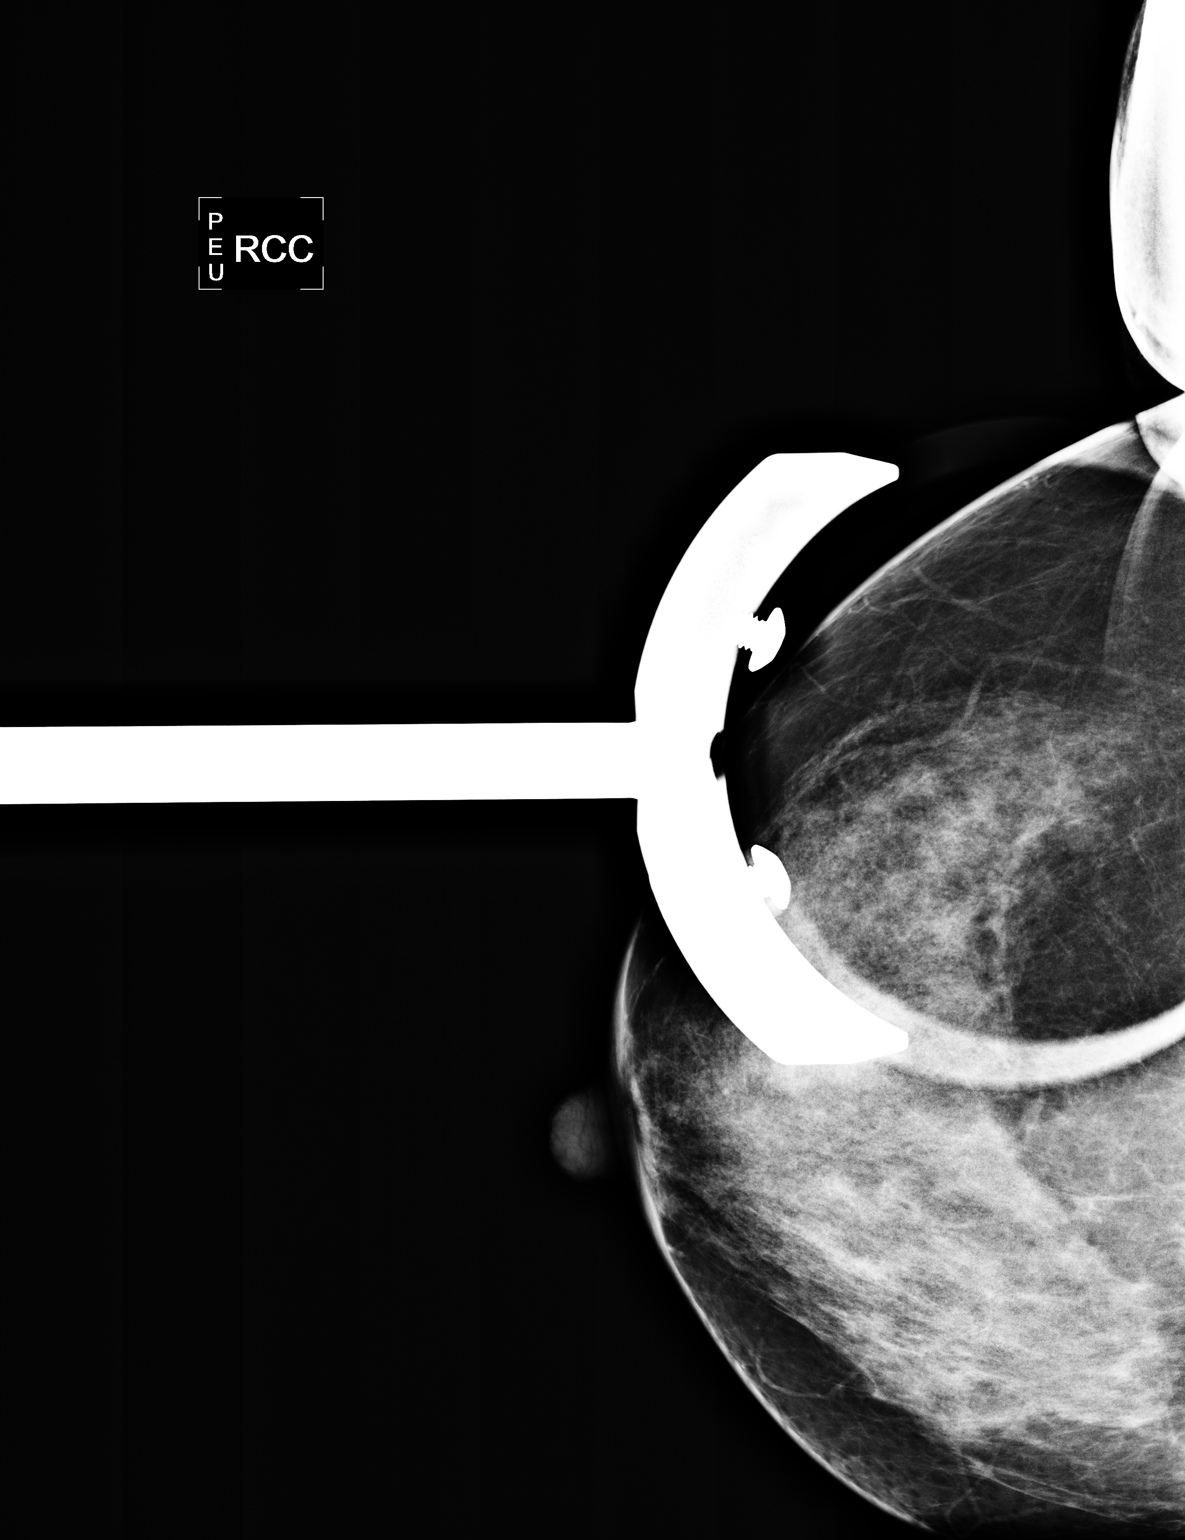

[R MLO (1 of 2)]
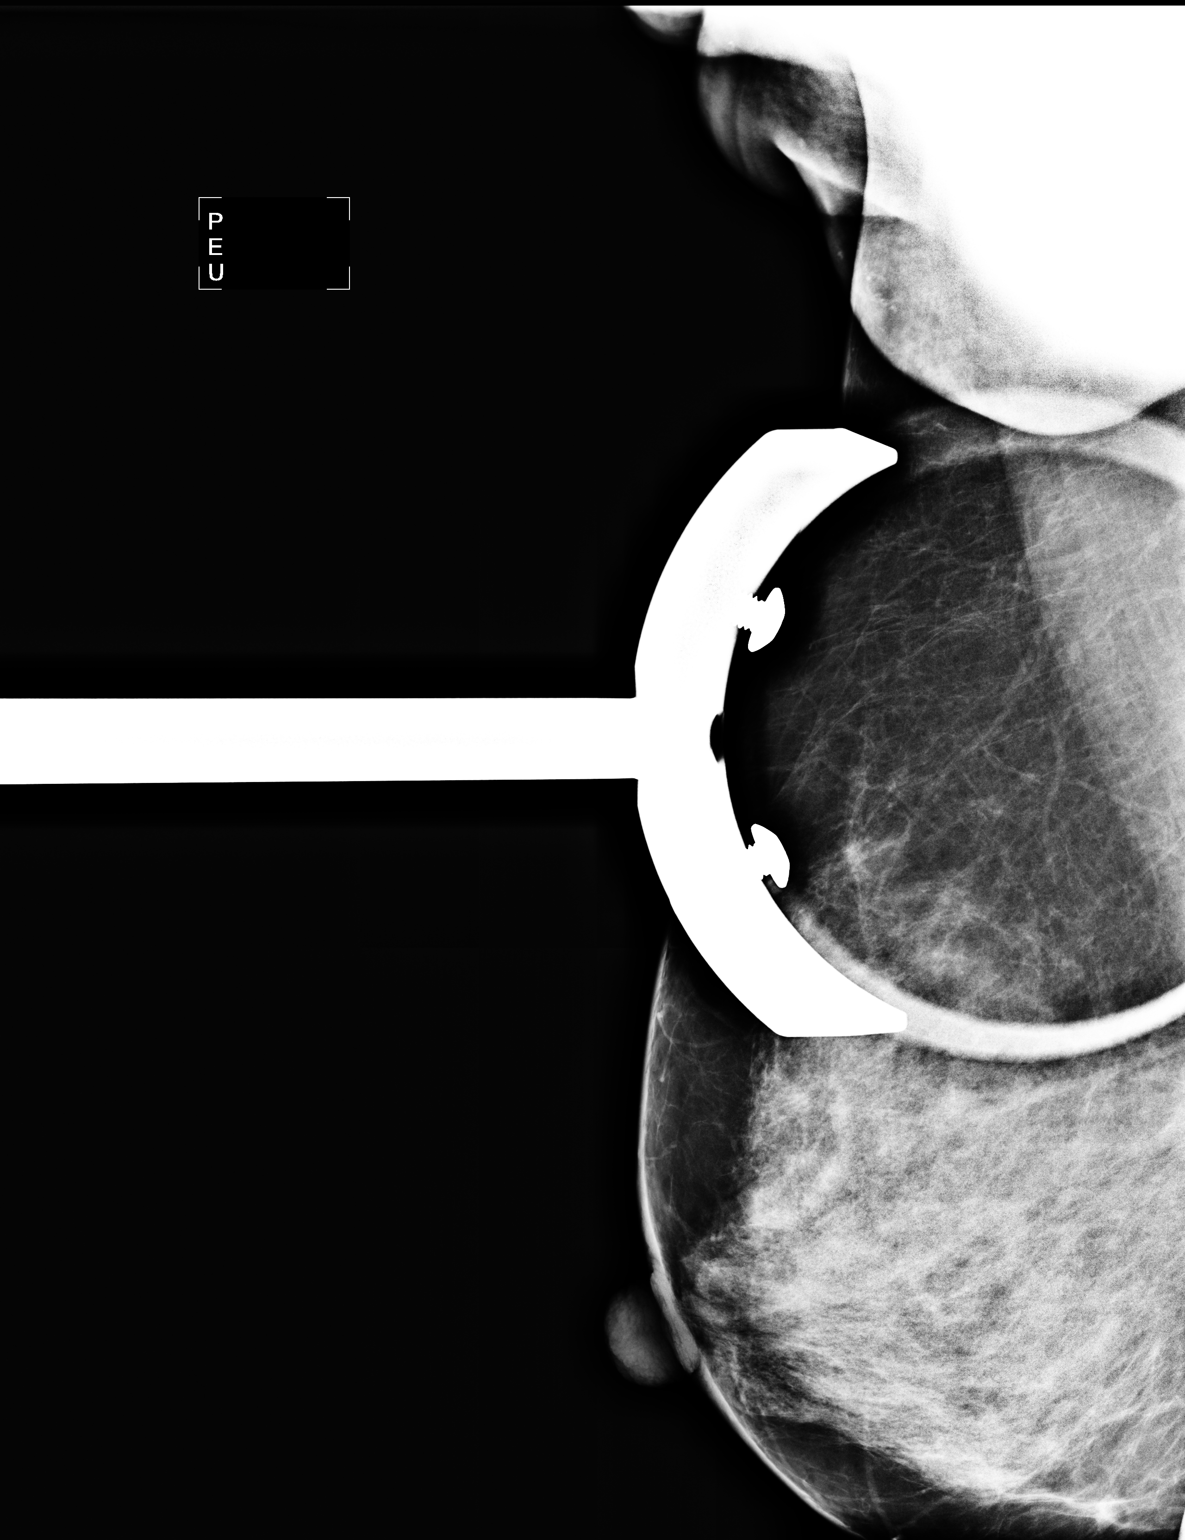

[R MLO (2 of 2)]
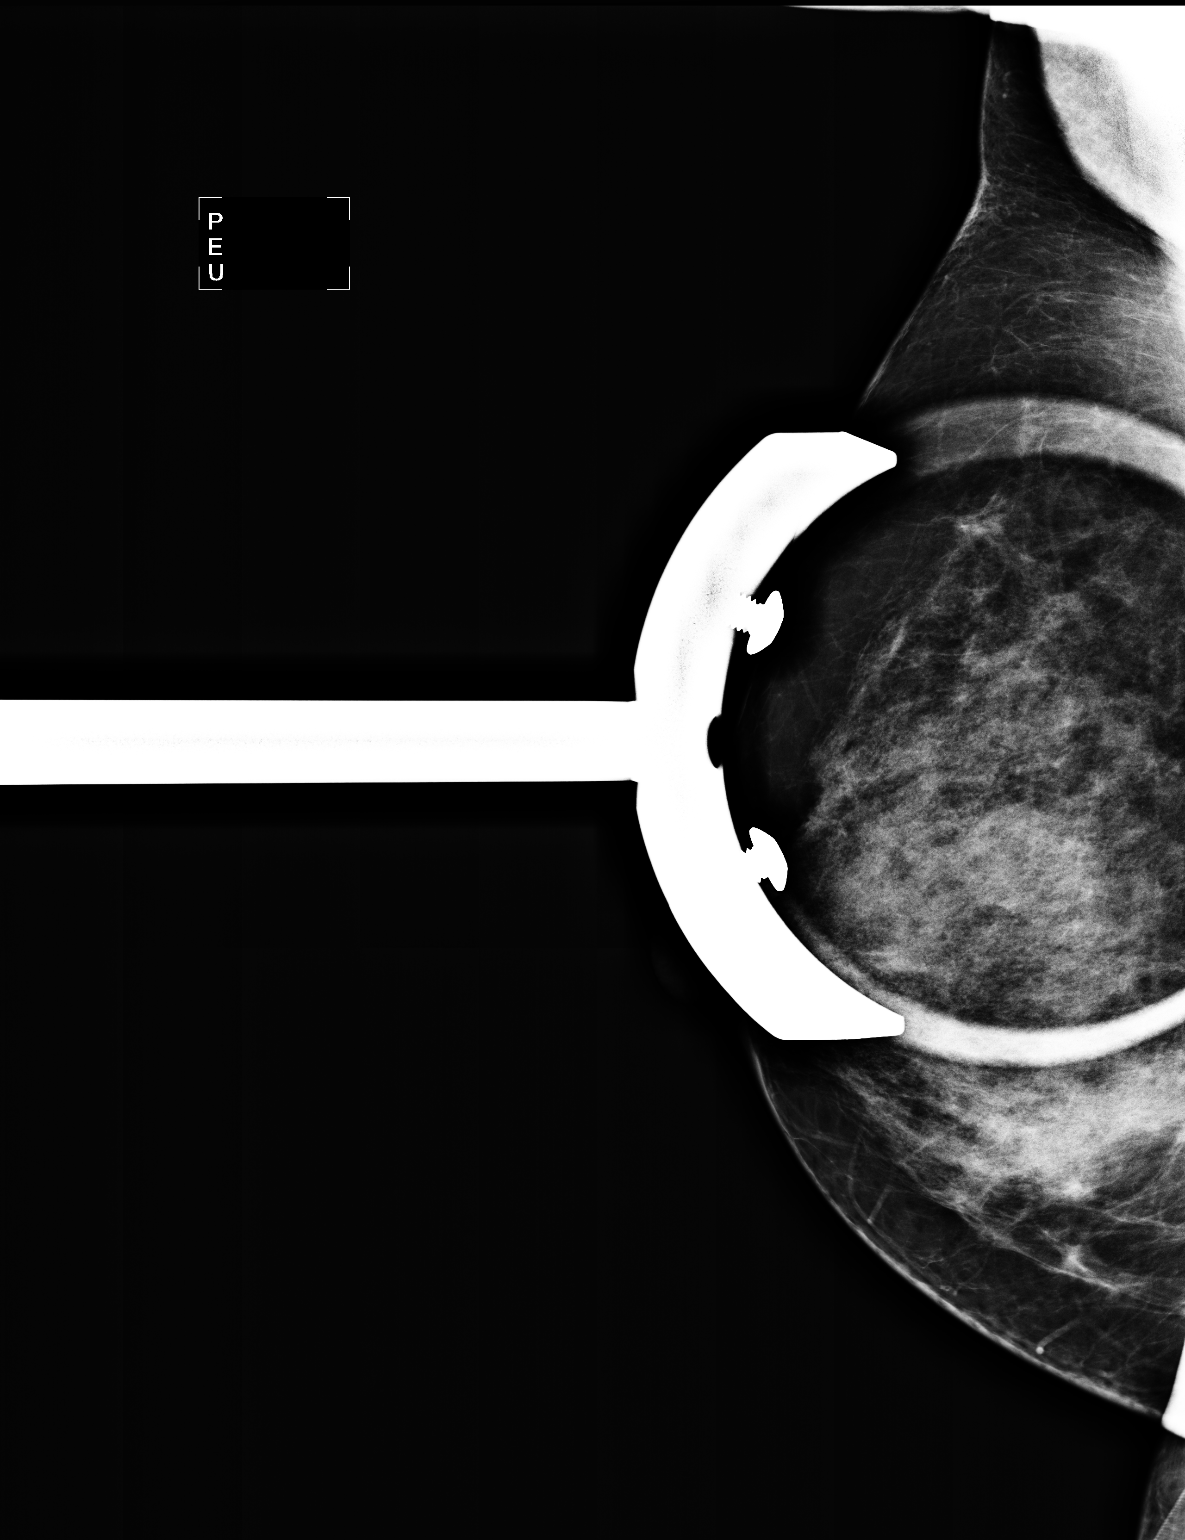

[3 of 3 positions shown; findings below may reference images not displayed]

FINDINGS: Spot compression views the right breast do not
demonstrate a persistent density or distortion.  The appearance is
consistent with a summation shadow.

On physical exam, there is no discrete palpable abnormality within
the lateral right breast or upper-outer quadrant of the right
breast.

Ultrasound is performed, showing normal-appearing fibroglandular
tissue.  There is no mass or distortion.
IMPRESSION: No persistent abnormality on additional evaluation of the right
breast.  The appearance is consistent with a summation shadow.

 BI-RADS CATEGORY 1:  Negative.

## 2014-07-03 ENCOUNTER — Other Ambulatory Visit (HOSPITAL_COMMUNITY): Payer: Self-pay | Admitting: Internal Medicine

## 2014-07-03 DIAGNOSIS — Z1231 Encounter for screening mammogram for malignant neoplasm of breast: Secondary | ICD-10-CM

## 2014-07-13 ENCOUNTER — Ambulatory Visit (HOSPITAL_COMMUNITY)
Admission: RE | Admit: 2014-07-13 | Discharge: 2014-07-13 | Disposition: A | Discharge status: Home / Self Care | Payer: Medicare Other | Source: Ambulatory Visit | Attending: Internal Medicine | Admitting: Internal Medicine

## 2014-07-13 DIAGNOSIS — Z1231 Encounter for screening mammogram for malignant neoplasm of breast: Secondary | ICD-10-CM | POA: Insufficient documentation

## 2014-11-01 DIAGNOSIS — F29 Unspecified psychosis not due to a substance or known physiological condition: Secondary | ICD-10-CM | POA: Diagnosis not present

## 2015-01-23 DIAGNOSIS — F29 Unspecified psychosis not due to a substance or known physiological condition: Secondary | ICD-10-CM | POA: Diagnosis not present

## 2015-04-24 DIAGNOSIS — F29 Unspecified psychosis not due to a substance or known physiological condition: Secondary | ICD-10-CM | POA: Diagnosis not present

## 2015-07-16 DIAGNOSIS — F29 Unspecified psychosis not due to a substance or known physiological condition: Secondary | ICD-10-CM | POA: Diagnosis not present

## 2015-07-31 DIAGNOSIS — Z1239 Encounter for other screening for malignant neoplasm of breast: Secondary | ICD-10-CM | POA: Diagnosis not present

## 2015-07-31 DIAGNOSIS — Z0000 Encounter for general adult medical examination without abnormal findings: Secondary | ICD-10-CM | POA: Diagnosis not present

## 2015-07-31 DIAGNOSIS — Z124 Encounter for screening for malignant neoplasm of cervix: Secondary | ICD-10-CM | POA: Diagnosis not present

## 2015-07-31 DIAGNOSIS — I10 Essential (primary) hypertension: Secondary | ICD-10-CM | POA: Diagnosis not present

## 2015-08-17 ENCOUNTER — Other Ambulatory Visit: Payer: Self-pay

## 2015-08-17 DIAGNOSIS — I10 Essential (primary) hypertension: Secondary | ICD-10-CM | POA: Diagnosis not present

## 2015-08-17 DIAGNOSIS — Z0000 Encounter for general adult medical examination without abnormal findings: Secondary | ICD-10-CM | POA: Diagnosis not present

## 2015-08-17 DIAGNOSIS — Z1231 Encounter for screening mammogram for malignant neoplasm of breast: Secondary | ICD-10-CM

## 2015-08-17 DIAGNOSIS — Z13220 Encounter for screening for lipoid disorders: Secondary | ICD-10-CM | POA: Diagnosis not present

## 2015-09-19 ENCOUNTER — Ambulatory Visit: Payer: Medicare Other

## 2015-10-16 DIAGNOSIS — F29 Unspecified psychosis not due to a substance or known physiological condition: Secondary | ICD-10-CM | POA: Diagnosis not present

## 2015-12-14 ENCOUNTER — Ambulatory Visit
Admission: RE | Admit: 2015-12-14 | Discharge: 2015-12-14 | Disposition: A | Discharge status: Home / Self Care | Payer: Medicare Other | Source: Ambulatory Visit

## 2015-12-14 DIAGNOSIS — Z1231 Encounter for screening mammogram for malignant neoplasm of breast: Secondary | ICD-10-CM | POA: Diagnosis not present

## 2016-02-13 DIAGNOSIS — F29 Unspecified psychosis not due to a substance or known physiological condition: Secondary | ICD-10-CM | POA: Diagnosis not present

## 2016-04-09 DIAGNOSIS — H35371 Puckering of macula, right eye: Secondary | ICD-10-CM | POA: Diagnosis not present

## 2016-04-09 DIAGNOSIS — H2513 Age-related nuclear cataract, bilateral: Secondary | ICD-10-CM | POA: Diagnosis not present

## 2016-04-09 DIAGNOSIS — H25013 Cortical age-related cataract, bilateral: Secondary | ICD-10-CM | POA: Diagnosis not present

## 2016-04-09 DIAGNOSIS — H35372 Puckering of macula, left eye: Secondary | ICD-10-CM | POA: Diagnosis not present

## 2016-05-05 DIAGNOSIS — F29 Unspecified psychosis not due to a substance or known physiological condition: Secondary | ICD-10-CM | POA: Diagnosis not present

## 2016-07-22 DIAGNOSIS — F29 Unspecified psychosis not due to a substance or known physiological condition: Secondary | ICD-10-CM | POA: Diagnosis not present

## 2016-07-23 DIAGNOSIS — Z0000 Encounter for general adult medical examination without abnormal findings: Secondary | ICD-10-CM | POA: Diagnosis not present

## 2016-07-23 DIAGNOSIS — Z6831 Body mass index (BMI) 31.0-31.9, adult: Secondary | ICD-10-CM | POA: Diagnosis not present

## 2016-11-10 DIAGNOSIS — F29 Unspecified psychosis not due to a substance or known physiological condition: Secondary | ICD-10-CM | POA: Diagnosis not present

## 2016-12-11 ENCOUNTER — Other Ambulatory Visit: Payer: Self-pay | Admitting: Internal Medicine

## 2016-12-11 DIAGNOSIS — Z1231 Encounter for screening mammogram for malignant neoplasm of breast: Secondary | ICD-10-CM

## 2016-12-30 ENCOUNTER — Ambulatory Visit
Admission: RE | Admit: 2016-12-30 | Discharge: 2016-12-30 | Disposition: A | Discharge status: Home / Self Care | Payer: Medicare Other | Source: Ambulatory Visit | Attending: Internal Medicine | Admitting: Internal Medicine

## 2016-12-30 DIAGNOSIS — Z1231 Encounter for screening mammogram for malignant neoplasm of breast: Secondary | ICD-10-CM | POA: Diagnosis not present

## 2017-01-27 DIAGNOSIS — F29 Unspecified psychosis not due to a substance or known physiological condition: Secondary | ICD-10-CM | POA: Diagnosis not present

## 2017-04-13 DIAGNOSIS — H1013 Acute atopic conjunctivitis, bilateral: Secondary | ICD-10-CM | POA: Diagnosis not present

## 2017-04-13 DIAGNOSIS — H2513 Age-related nuclear cataract, bilateral: Secondary | ICD-10-CM | POA: Diagnosis not present

## 2017-04-13 DIAGNOSIS — H35373 Puckering of macula, bilateral: Secondary | ICD-10-CM | POA: Diagnosis not present

## 2017-04-13 DIAGNOSIS — H25013 Cortical age-related cataract, bilateral: Secondary | ICD-10-CM | POA: Diagnosis not present

## 2017-05-26 DIAGNOSIS — F29 Unspecified psychosis not due to a substance or known physiological condition: Secondary | ICD-10-CM | POA: Diagnosis not present

## 2017-06-06 DIAGNOSIS — Z23 Encounter for immunization: Secondary | ICD-10-CM | POA: Diagnosis not present

## 2017-08-25 DIAGNOSIS — F29 Unspecified psychosis not due to a substance or known physiological condition: Secondary | ICD-10-CM | POA: Diagnosis not present

## 2017-11-27 DIAGNOSIS — E785 Hyperlipidemia, unspecified: Secondary | ICD-10-CM | POA: Diagnosis not present

## 2017-11-27 DIAGNOSIS — I10 Essential (primary) hypertension: Secondary | ICD-10-CM | POA: Diagnosis not present

## 2017-11-30 DIAGNOSIS — F29 Unspecified psychosis not due to a substance or known physiological condition: Secondary | ICD-10-CM | POA: Diagnosis not present

## 2017-12-14 DIAGNOSIS — I739 Peripheral vascular disease, unspecified: Secondary | ICD-10-CM | POA: Diagnosis not present

## 2017-12-14 DIAGNOSIS — I8002 Phlebitis and thrombophlebitis of superficial vessels of left lower extremity: Secondary | ICD-10-CM | POA: Diagnosis not present

## 2017-12-14 DIAGNOSIS — R601 Generalized edema: Secondary | ICD-10-CM | POA: Diagnosis not present

## 2017-12-14 DIAGNOSIS — E669 Obesity, unspecified: Secondary | ICD-10-CM | POA: Diagnosis not present

## 2017-12-14 DIAGNOSIS — I10 Essential (primary) hypertension: Secondary | ICD-10-CM | POA: Diagnosis not present

## 2017-12-14 DIAGNOSIS — Z79899 Other long term (current) drug therapy: Secondary | ICD-10-CM | POA: Diagnosis not present

## 2018-01-28 ENCOUNTER — Other Ambulatory Visit: Payer: Self-pay | Admitting: Family

## 2018-01-28 DIAGNOSIS — Z1231 Encounter for screening mammogram for malignant neoplasm of breast: Secondary | ICD-10-CM

## 2018-02-22 ENCOUNTER — Ambulatory Visit
Admission: RE | Admit: 2018-02-22 | Discharge: 2018-02-22 | Disposition: A | Discharge status: Home / Self Care | Payer: Medicare Other | Source: Ambulatory Visit | Attending: Family | Admitting: Family

## 2018-02-22 DIAGNOSIS — Z1231 Encounter for screening mammogram for malignant neoplasm of breast: Secondary | ICD-10-CM

## 2018-03-04 DIAGNOSIS — F29 Unspecified psychosis not due to a substance or known physiological condition: Secondary | ICD-10-CM | POA: Diagnosis not present

## 2018-03-04 DIAGNOSIS — F4310 Post-traumatic stress disorder, unspecified: Secondary | ICD-10-CM | POA: Diagnosis not present

## 2018-03-15 DIAGNOSIS — R601 Generalized edema: Secondary | ICD-10-CM | POA: Diagnosis not present

## 2018-03-15 DIAGNOSIS — M79605 Pain in left leg: Secondary | ICD-10-CM | POA: Diagnosis not present

## 2018-03-16 DIAGNOSIS — Z79899 Other long term (current) drug therapy: Secondary | ICD-10-CM | POA: Diagnosis not present

## 2018-03-16 DIAGNOSIS — M79605 Pain in left leg: Secondary | ICD-10-CM | POA: Diagnosis not present

## 2018-03-16 DIAGNOSIS — E669 Obesity, unspecified: Secondary | ICD-10-CM | POA: Diagnosis not present

## 2018-03-16 DIAGNOSIS — I10 Essential (primary) hypertension: Secondary | ICD-10-CM | POA: Diagnosis not present

## 2018-04-15 DIAGNOSIS — R601 Generalized edema: Secondary | ICD-10-CM | POA: Diagnosis not present

## 2018-04-15 DIAGNOSIS — Z79899 Other long term (current) drug therapy: Secondary | ICD-10-CM | POA: Diagnosis not present

## 2018-04-23 DIAGNOSIS — R600 Localized edema: Secondary | ICD-10-CM | POA: Diagnosis not present

## 2018-06-01 DIAGNOSIS — F4310 Post-traumatic stress disorder, unspecified: Secondary | ICD-10-CM | POA: Diagnosis not present

## 2018-06-01 DIAGNOSIS — F29 Unspecified psychosis not due to a substance or known physiological condition: Secondary | ICD-10-CM | POA: Diagnosis not present

## 2018-10-07 DIAGNOSIS — F4310 Post-traumatic stress disorder, unspecified: Secondary | ICD-10-CM | POA: Diagnosis not present

## 2018-10-07 DIAGNOSIS — F29 Unspecified psychosis not due to a substance or known physiological condition: Secondary | ICD-10-CM | POA: Diagnosis not present

## 2018-11-05 DIAGNOSIS — E781 Pure hyperglyceridemia: Secondary | ICD-10-CM | POA: Diagnosis not present

## 2018-11-05 DIAGNOSIS — Z0000 Encounter for general adult medical examination without abnormal findings: Secondary | ICD-10-CM | POA: Diagnosis not present

## 2018-11-05 DIAGNOSIS — E8881 Metabolic syndrome: Secondary | ICD-10-CM | POA: Diagnosis not present

## 2018-11-05 DIAGNOSIS — Z79899 Other long term (current) drug therapy: Secondary | ICD-10-CM | POA: Diagnosis not present

## 2018-11-05 DIAGNOSIS — E661 Drug-induced obesity: Secondary | ICD-10-CM | POA: Diagnosis not present

## 2018-11-05 DIAGNOSIS — R739 Hyperglycemia, unspecified: Secondary | ICD-10-CM | POA: Diagnosis not present

## 2019-01-07 DIAGNOSIS — F4310 Post-traumatic stress disorder, unspecified: Secondary | ICD-10-CM | POA: Diagnosis not present

## 2019-01-07 DIAGNOSIS — F29 Unspecified psychosis not due to a substance or known physiological condition: Secondary | ICD-10-CM | POA: Diagnosis not present

## 2019-01-19 ENCOUNTER — Other Ambulatory Visit: Payer: Self-pay | Admitting: Family

## 2019-01-19 DIAGNOSIS — Z1231 Encounter for screening mammogram for malignant neoplasm of breast: Secondary | ICD-10-CM

## 2019-01-27 DIAGNOSIS — H25013 Cortical age-related cataract, bilateral: Secondary | ICD-10-CM | POA: Diagnosis not present

## 2019-01-27 DIAGNOSIS — H35373 Puckering of macula, bilateral: Secondary | ICD-10-CM | POA: Diagnosis not present

## 2019-01-27 DIAGNOSIS — H35033 Hypertensive retinopathy, bilateral: Secondary | ICD-10-CM | POA: Diagnosis not present

## 2019-01-27 DIAGNOSIS — H2513 Age-related nuclear cataract, bilateral: Secondary | ICD-10-CM | POA: Diagnosis not present

## 2019-03-14 ENCOUNTER — Ambulatory Visit: Payer: Medicare Other

## 2019-05-03 ENCOUNTER — Ambulatory Visit
Admission: RE | Admit: 2019-05-03 | Discharge: 2019-05-03 | Disposition: A | Discharge status: Home / Self Care | Payer: Medicare Other | Source: Ambulatory Visit | Attending: Family | Admitting: Family

## 2019-05-03 ENCOUNTER — Other Ambulatory Visit: Payer: Self-pay

## 2019-05-03 DIAGNOSIS — Z1231 Encounter for screening mammogram for malignant neoplasm of breast: Secondary | ICD-10-CM

## 2019-05-13 DIAGNOSIS — F4310 Post-traumatic stress disorder, unspecified: Secondary | ICD-10-CM | POA: Diagnosis not present

## 2019-05-13 DIAGNOSIS — F29 Unspecified psychosis not due to a substance or known physiological condition: Secondary | ICD-10-CM | POA: Diagnosis not present

## 2019-05-20 DIAGNOSIS — Z23 Encounter for immunization: Secondary | ICD-10-CM | POA: Diagnosis not present

## 2019-05-23 DIAGNOSIS — Z79899 Other long term (current) drug therapy: Secondary | ICD-10-CM | POA: Diagnosis not present

## 2019-05-23 DIAGNOSIS — N3000 Acute cystitis without hematuria: Secondary | ICD-10-CM | POA: Diagnosis not present

## 2019-05-23 DIAGNOSIS — I10 Essential (primary) hypertension: Secondary | ICD-10-CM | POA: Diagnosis not present

## 2019-05-23 DIAGNOSIS — E669 Obesity, unspecified: Secondary | ICD-10-CM | POA: Diagnosis not present

## 2019-07-25 DIAGNOSIS — F4310 Post-traumatic stress disorder, unspecified: Secondary | ICD-10-CM | POA: Diagnosis not present

## 2019-07-25 DIAGNOSIS — F29 Unspecified psychosis not due to a substance or known physiological condition: Secondary | ICD-10-CM | POA: Diagnosis not present

## 2019-10-10 DIAGNOSIS — F29 Unspecified psychosis not due to a substance or known physiological condition: Secondary | ICD-10-CM | POA: Diagnosis not present

## 2019-10-10 DIAGNOSIS — F4310 Post-traumatic stress disorder, unspecified: Secondary | ICD-10-CM | POA: Diagnosis not present

## 2019-10-27 ENCOUNTER — Ambulatory Visit: Payer: Medicare Other

## 2019-10-31 ENCOUNTER — Ambulatory Visit: Payer: Medicare Other | Attending: Family

## 2019-10-31 DIAGNOSIS — Z23 Encounter for immunization: Secondary | ICD-10-CM | POA: Insufficient documentation

## 2019-10-31 NOTE — Progress Notes (Signed)
   Covid-19 Vaccination Clinic  Name:  [name]    MRN: [id number] DOB: 04/04/1956  10/31/2019  Ms. [name] was observed post Covid-19 immunization for 15 minutes without incidence. She was provided with Vaccine Information Sheet and instruction to access the V-Safe system.   [name] was instructed to call 911 with any severe reactions post vaccine: Marland Kitchen Difficulty breathing  . Swelling of your face and throat  . A fast heartbeat  . A bad rash all over your body  . Dizziness and weakness    Immunizations Administered    Name Date Dose VIS Date Route   Moderna COVID-19 Vaccine 10/31/2019 10:19 AM 0.5 mL 08/09/2019 Intramuscular   Manufacturer: Moderna   Lot: [id number]   [id number]

## 2019-11-29 ENCOUNTER — Ambulatory Visit: Payer: Medicare Other | Attending: Family

## 2019-11-29 DIAGNOSIS — Z23 Encounter for immunization: Secondary | ICD-10-CM

## 2019-11-29 NOTE — Progress Notes (Signed)
   Covid-19 Vaccination Clinic  Name:  CALIOPE VICENCIO    MRN: [id number] DOB: [birth year]  11/29/2019  Ms. Nevius was observed post Covid-19 immunization for 15 minutes without incident. She was provided with Vaccine Information Sheet and instruction to access the V-Safe system.   Ms. Thorpe was instructed to call 911 with any severe reactions post vaccine: Marland Kitchen Difficulty breathing  . Swelling of face and throat  . A fast heartbeat  . A bad rash all over body  . Dizziness and weakness   Immunizations Administered    Name Date Dose VIS Date Route   Moderna COVID-19 Vaccine 11/29/2019 11:58 AM 0.5 mL 08/09/2019 Intramuscular   Manufacturer: Moderna   LotHQ:[id number]   [place]:[id number]

## 2019-12-19 DIAGNOSIS — L309 Dermatitis, unspecified: Secondary | ICD-10-CM | POA: Diagnosis not present

## 2019-12-19 DIAGNOSIS — Z79899 Other long term (current) drug therapy: Secondary | ICD-10-CM | POA: Diagnosis not present

## 2019-12-19 DIAGNOSIS — I10 Essential (primary) hypertension: Secondary | ICD-10-CM | POA: Diagnosis not present

## 2020-01-02 DIAGNOSIS — F29 Unspecified psychosis not due to a substance or known physiological condition: Secondary | ICD-10-CM | POA: Diagnosis not present

## 2020-01-02 DIAGNOSIS — F4310 Post-traumatic stress disorder, unspecified: Secondary | ICD-10-CM | POA: Diagnosis not present

## 2020-03-26 DIAGNOSIS — F4310 Post-traumatic stress disorder, unspecified: Secondary | ICD-10-CM | POA: Diagnosis not present

## 2020-03-26 DIAGNOSIS — F29 Unspecified psychosis not due to a substance or known physiological condition: Secondary | ICD-10-CM | POA: Diagnosis not present

## 2020-03-27 ENCOUNTER — Other Ambulatory Visit: Payer: Self-pay | Admitting: Family

## 2020-03-27 DIAGNOSIS — Z1231 Encounter for screening mammogram for malignant neoplasm of breast: Secondary | ICD-10-CM

## 2020-05-07 ENCOUNTER — Ambulatory Visit
Admission: RE | Admit: 2020-05-07 | Discharge: 2020-05-07 | Disposition: A | Discharge status: Home / Self Care | Payer: Medicare (Managed Care) | Source: Ambulatory Visit | Attending: Family | Admitting: Family

## 2020-05-07 ENCOUNTER — Other Ambulatory Visit: Payer: Self-pay

## 2020-05-07 DIAGNOSIS — Z1231 Encounter for screening mammogram for malignant neoplasm of breast: Secondary | ICD-10-CM

## 2021-04-17 ENCOUNTER — Other Ambulatory Visit: Payer: Self-pay | Admitting: Family

## 2021-04-17 ENCOUNTER — Other Ambulatory Visit: Payer: Self-pay | Admitting: Nurse Practitioner

## 2021-04-17 DIAGNOSIS — Z1231 Encounter for screening mammogram for malignant neoplasm of breast: Secondary | ICD-10-CM

## 2021-05-09 ENCOUNTER — Other Ambulatory Visit: Payer: Self-pay

## 2021-05-09 ENCOUNTER — Ambulatory Visit
Admission: RE | Admit: 2021-05-09 | Discharge: 2021-05-09 | Disposition: A | Discharge status: Home / Self Care | Payer: 59 | Source: Ambulatory Visit | Attending: Nurse Practitioner | Admitting: Nurse Practitioner

## 2021-05-09 DIAGNOSIS — Z1231 Encounter for screening mammogram for malignant neoplasm of breast: Secondary | ICD-10-CM

## 2021-05-13 LAB — COLOGUARD

## 2021-05-25 LAB — COLOGUARD: COLOGUARD: NEGATIVE

## 2022-03-21 ENCOUNTER — Other Ambulatory Visit: Payer: Self-pay | Admitting: Nurse Practitioner

## 2022-03-21 DIAGNOSIS — Z1231 Encounter for screening mammogram for malignant neoplasm of breast: Secondary | ICD-10-CM

## 2022-05-14 ENCOUNTER — Ambulatory Visit
Admission: RE | Admit: 2022-05-14 | Discharge: 2022-05-14 | Disposition: A | Discharge status: Home / Self Care | Payer: 59 | Source: Ambulatory Visit | Attending: Nurse Practitioner | Admitting: Nurse Practitioner

## 2022-05-14 DIAGNOSIS — Z1231 Encounter for screening mammogram for malignant neoplasm of breast: Secondary | ICD-10-CM

## 2022-08-07 ENCOUNTER — Encounter (HOSPITAL_COMMUNITY): Payer: Self-pay | Admitting: Emergency Medicine

## 2022-08-07 ENCOUNTER — Emergency Department (HOSPITAL_COMMUNITY): Payer: 59

## 2022-08-07 ENCOUNTER — Emergency Department (HOSPITAL_COMMUNITY)
Admission: EM | Admit: 2022-08-07 | Discharge: 2022-08-07 | Disposition: A | Discharge status: Home / Self Care | Payer: 59 | Attending: Emergency Medicine | Admitting: Emergency Medicine

## 2022-08-07 DIAGNOSIS — S0993XA Unspecified injury of face, initial encounter: Secondary | ICD-10-CM | POA: Diagnosis present

## 2022-08-07 DIAGNOSIS — S0990XA Unspecified injury of head, initial encounter: Secondary | ICD-10-CM | POA: Insufficient documentation

## 2022-08-07 DIAGNOSIS — W0110XA Fall on same level from slipping, tripping and stumbling with subsequent striking against unspecified object, initial encounter: Secondary | ICD-10-CM | POA: Insufficient documentation

## 2022-08-07 DIAGNOSIS — W19XXXA Unspecified fall, initial encounter: Secondary | ICD-10-CM

## 2022-08-07 HISTORY — DX: Gastro-esophageal reflux disease without esophagitis: K21.9

## 2022-08-07 HISTORY — DX: Essential (primary) hypertension: I10

## 2022-08-07 LAB — CBC
HCT: 39.4 % (ref 36.0–46.0)
Hemoglobin: 12.4 g/dL (ref 12.0–15.0)
MCH: 26.6 pg (ref 26.0–34.0)
MCHC: 31.5 g/dL (ref 30.0–36.0)
MCV: 84.5 fL (ref 80.0–100.0)
Platelets: 217 10*3/uL (ref 150–400)
RBC: 4.66 MIL/uL (ref 3.87–5.11)
RDW: 15.5 % (ref 11.5–15.5)
WBC: 5.2 10*3/uL (ref 4.0–10.5)
nRBC: 0 % (ref 0.0–0.2)

## 2022-08-07 NOTE — ED Provider Notes (Signed)
[facility] EMERGENCY DEPARTMENT Provider Note   CSN: [id number] Arrival date & time: 08/07/22  1315     History  Chief Complaint  Patient presents with   [name]    [name] is a 66 y.o. female.  She is here for evaluation of injuries after a fall.  She said she fell last night and hit her face.  She denies loss of consciousness.  Complaining of some bleeding in her mouth.  She also says her seasonal allergies are acting up.  She has not taken her loratadine yet.  She denies any other injuries or complaints.  When reviewed her triage note talked about her legs were stiff.  She tells me her legs are always stiff no new trauma.  She is not sure why she fell.  No headache.  The history is provided by the patient.  Fall This is a new problem. The current episode started yesterday. Pertinent negatives include no chest pain, no abdominal pain, no headaches and no shortness of breath. Nothing aggravates the symptoms. Nothing relieves the symptoms. She has tried nothing for the symptoms. The treatment provided moderate relief.       Home Medications Prior to Admission medications   Not on File      Allergies    Benadryl [diphenhydramine]    Review of Systems   Review of Systems  Constitutional:  Negative for fever.  HENT:  Negative for trouble swallowing.   Respiratory:  Negative for shortness of breath.   Cardiovascular:  Negative for chest pain.  Gastrointestinal:  Negative for abdominal pain.  Musculoskeletal:  Negative for neck pain.  Neurological:  Negative for headaches.    Physical Exam Updated Vital Signs BP (!) 143/95 (BP Location: Right Arm)   Pulse (!) 114   Temp 99 F (37.2 C)   Resp 14   Ht '5\' 4"'$  (1.626 m)   Wt 78.5 kg   SpO2 97%   BMI 29.70 kg/m  Physical Exam Vitals and nursing note reviewed.  Constitutional:      General: She is not in acute distress.    Appearance: Normal appearance. She is well-developed.  HENT:     Head:  Normocephalic and atraumatic.     Nose: Nose normal.     Mouth/Throat:     Mouth: Mucous membranes are moist.     Comments: She has a small area of recent bleeding of her upper maxilla gingiva. Eyes:     Conjunctiva/sclera: Conjunctivae normal.  Cardiovascular:     Rate and Rhythm: Normal rate and regular rhythm.     Heart sounds: No murmur heard. Pulmonary:     Effort: Pulmonary effort is normal. No respiratory distress.     Breath sounds: Normal breath sounds.  Abdominal:     Palpations: Abdomen is soft.     Tenderness: There is no abdominal tenderness. There is no guarding or rebound.  Musculoskeletal:     Cervical back: Neck supple.  Skin:    General: Skin is warm and dry.     Capillary Refill: Capillary refill takes less than 2 seconds.  Neurological:     General: No focal deficit present.     Mental Status: She is alert.     ED Results / Procedures / Treatments   Labs (all labs ordered are listed, but only abnormal results are displayed) Labs Reviewed  RESP PANEL BY RT-PCR (FLU A&B, COVID) [id number]  CBC    EKG None  Radiology DG Chest 2  View  Result Date: 08/07/2022 CLINICAL DATA:  Cough. EXAM: CHEST - 2 VIEW COMPARISON:  Chest two views 12/14/2009 FINDINGS: Cardiac silhouette and mediastinal contours are within normal limits. The lungs are clear. No pleural effusion or pneumothorax. Mild multilevel degenerative disc changes of the mid to upper thoracic spine with mildly increased kyphotic angulation compared to remote 2011 prior radiographs. Partial visualization of an IVC filter overlying the right upper abdomen. IMPRESSION: No active cardiopulmonary disease. Electronically Signed   By: Yvonne Kendall M.D.   On: 08/07/2022 15:24   CT HEAD WO CONTRAST (5MM)  Result Date: 08/07/2022 CLINICAL DATA:  Trauma EXAM: CT HEAD WITHOUT CONTRAST TECHNIQUE: Contiguous axial images were obtained from the base of the skull through the vertex without intravenous contrast.  RADIATION DOSE REDUCTION: This exam was performed according to the departmental dose-optimization program which includes automated exposure control, adjustment of the mA and/or kV according to patient size and/or use of iterative reconstruction technique. COMPARISON:  12/12/2009. FINDINGS: Brain: No evidence of acute infarction, hemorrhage, hydrocephalus, extra-axial collection or mass lesion/mass effect. Vascular: No hyperdense vessel or unexpected calcification. Skull: Normal. Negative for fracture or focal lesion. Sinuses/Orbits: Mucoperiosteal thickening and a subcentimeter cyst or polyp noted left maxillary antrum. Other: None. IMPRESSION: No acute intracranial process. Electronically Signed   By: Sammie Bench M.D.   On: 08/07/2022 14:59    Procedures Procedures    Medications Ordered in ED Medications - No data to display  ED Course/ Medical Decision Making/ A&P Clinical Course as of 08/07/22 1747  Thu Aug 07, 2022  1747 Chest x-ray and head CT reviewed by me no acute traumatic findings. [MB]    Clinical Course User Index [MB] Hayden Rasmussen, MD                           Medical Decision Making  This patient complains of bleeding in her mouth after fall; this involves an extensive number of treatment Options and is a complaint that carries with it a high risk of complications and morbidity. The differential includes dental trauma, lip black, nosebleed, head injury  I ordered, reviewed and interpreted labs, which included CBC with normal white count normal hemoglobin I ordered imaging studies which included CT head and I independently    visualized and interpreted imaging which showed no acute traumatic findings, did comment upon a cyst or polyp on maxilla Previous records obtained and reviewed in epic including recent PCP visits  Cardiac monitoring reviewed, normal sinus rhythm Social determinants considered, no significant barriers Critical Interventions: None  After  the interventions stated above, I reevaluated the patient and found patient to be in no distress Admission and further testing considered, no indications for admission or further work-up at this time.  No active bleeding.  Recommended close follow-up with PCP.  Return instructions discussed         Final Clinical Impression(s) / ED Diagnoses Final diagnoses:  Fall, initial encounter  Traumatic injury of mouth    Rx / DC Orders ED Discharge Orders     None         Hayden Rasmussen, MD 08/08/22 1059

## 2022-08-07 NOTE — ED Triage Notes (Signed)
Pt reports a fall last night where she cut her gums. Pt denies hitting head. Pt states her allergies have been acting up. Endorses bilateral leg stiffness.

## 2022-08-07 NOTE — Discharge Instructions (Signed)
You were seen in the emergency department for evaluation of injuries after a fall.  You had a small amount of blood in your mouth coming from your upper gum.  Please use warm salt water rinses.  Follow-up with your regular doctor.  Return to the emergency department if any worsening or concerning symptoms.

## 2022-08-07 NOTE — ED Provider Triage Note (Signed)
Emergency Medicine Provider Triage Evaluation Note  [name] , a 66 y.o. female  was evaluated in triage.  Pt complains of fall. Tripped on something on the floor last night states she struck her face on the ground. Was able to get up after 5 mintues.  No LOC Nausea or vomiting.   Review of Systems  Positive: Mouth pain Negative: Fever   Physical Exam  BP (!) 143/95 (BP Location: Right Arm)   Pulse (!) 114   Temp 99 F (37.2 C)   Resp 14   Ht '5\' 4"'$  (1.626 m)   Wt 78.5 kg   SpO2 97%   BMI 29.70 kg/m  Gen:   Awake, no distress   Resp:  Normal effort  MSK:   Moves extremities without difficulty  Other:  Slow to follow commands but Aox3 Moves all four extremities.   Alert and oriented to self, place, time and event.   Speech is fluent, clear without dysarthria or dysphasia.   Strength 5/5 in upper/lower extremities   Sensation intact in upper/lower extremities   Normal gait.  CN I not tested  CN II grossly intact visual fields bilaterally. Did not visualize posterior eye.  CN III, IV, VI PERRLA and EOMs intact bilaterally  CN V Intact sensation to sharp and light touch to the face  CN VII facial movements symmetric  CN VIII not tested  CN IX, X no uvula deviation, symmetric rise of soft palate  CN XI 5/5 SCM and trapezius strength bilaterally  CN XII Midline tongue protrusion, symmetric L/R movements    Medical Decision Making  Medically screening exam initiated at 2:13 PM.  Appropriate orders placed.  LASHELLE KOY was informed that the remainder of the evaluation will be completed by another provider, this initial triage assessment does not replace that evaluation, and the importance of remaining in the ED until their evaluation is complete.  Head CT for fall, EKG and CXR given her cough  Basic lasbs    Tedd Sias, [place] 08/07/22 1417

## 2023-04-10 ENCOUNTER — Other Ambulatory Visit: Payer: Self-pay | Admitting: Nurse Practitioner

## 2023-04-10 DIAGNOSIS — Z1231 Encounter for screening mammogram for malignant neoplasm of breast: Secondary | ICD-10-CM

## 2023-05-19 ENCOUNTER — Ambulatory Visit
Admission: RE | Admit: 2023-05-19 | Discharge: 2023-05-19 | Disposition: A | Discharge status: Home / Self Care | Payer: 59 | Source: Ambulatory Visit | Attending: Nurse Practitioner | Admitting: Nurse Practitioner

## 2023-05-19 DIAGNOSIS — Z1231 Encounter for screening mammogram for malignant neoplasm of breast: Secondary | ICD-10-CM

## 2024-04-11 ENCOUNTER — Other Ambulatory Visit: Payer: Self-pay | Admitting: Nurse Practitioner

## 2024-04-11 DIAGNOSIS — Z1231 Encounter for screening mammogram for malignant neoplasm of breast: Secondary | ICD-10-CM

## 2024-05-23 ENCOUNTER — Ambulatory Visit
Admission: RE | Admit: 2024-05-23 | Discharge: 2024-05-23 | Disposition: A | Discharge status: Home / Self Care | Source: Ambulatory Visit | Attending: Nurse Practitioner | Admitting: Nurse Practitioner

## 2024-05-23 DIAGNOSIS — Z1231 Encounter for screening mammogram for malignant neoplasm of breast: Secondary | ICD-10-CM
